# Patient Record
Sex: Female | Born: 1965 | Race: White | Hispanic: No | Marital: Married | Smoking: Current every day smoker
Health system: Southern US, Community
[De-identification: ages and names within clinical notes are randomized; demographics above are authoritative.]

## PROBLEM LIST (undated history)

## (undated) DIAGNOSIS — G43909 Migraine, unspecified, not intractable, without status migrainosus: Secondary | ICD-10-CM

## (undated) DIAGNOSIS — K5909 Other constipation: Secondary | ICD-10-CM

## (undated) DIAGNOSIS — G4730 Sleep apnea, unspecified: Secondary | ICD-10-CM

## (undated) DIAGNOSIS — N83201 Unspecified ovarian cyst, right side: Secondary | ICD-10-CM

---

## 1999-03-01 ENCOUNTER — Other Ambulatory Visit: Admission: RE | Admit: 1999-03-01 | Discharge: 1999-03-01 | Payer: Self-pay | Admitting: Obstetrics and Gynecology

## 1999-04-28 ENCOUNTER — Encounter: Admission: RE | Admit: 1999-04-28 | Discharge: 1999-04-28 | Payer: Self-pay | Admitting: Family Medicine

## 1999-04-28 ENCOUNTER — Encounter: Payer: Self-pay | Admitting: Family Medicine

## 2000-04-01 ENCOUNTER — Other Ambulatory Visit: Admission: RE | Admit: 2000-04-01 | Discharge: 2000-04-01 | Payer: Self-pay | Admitting: Obstetrics and Gynecology

## 2001-05-13 ENCOUNTER — Other Ambulatory Visit: Admission: RE | Admit: 2001-05-13 | Discharge: 2001-05-13 | Payer: Self-pay | Admitting: Obstetrics and Gynecology

## 2002-06-04 ENCOUNTER — Other Ambulatory Visit: Admission: RE | Admit: 2002-06-04 | Discharge: 2002-06-04 | Payer: Self-pay | Admitting: Obstetrics and Gynecology

## 2003-07-21 ENCOUNTER — Ambulatory Visit (HOSPITAL_BASED_OUTPATIENT_CLINIC_OR_DEPARTMENT_OTHER): Admission: RE | Admit: 2003-07-21 | Discharge: 2003-07-21 | Payer: Self-pay | Admitting: Family Medicine

## 2003-08-25 ENCOUNTER — Ambulatory Visit (HOSPITAL_BASED_OUTPATIENT_CLINIC_OR_DEPARTMENT_OTHER): Admission: RE | Admit: 2003-08-25 | Discharge: 2003-08-25 | Payer: Self-pay | Admitting: Family Medicine

## 2003-09-02 ENCOUNTER — Other Ambulatory Visit: Admission: RE | Admit: 2003-09-02 | Discharge: 2003-09-02 | Payer: Self-pay | Admitting: Obstetrics and Gynecology

## 2004-12-25 ENCOUNTER — Other Ambulatory Visit: Admission: RE | Admit: 2004-12-25 | Discharge: 2004-12-25 | Payer: Self-pay | Admitting: Obstetrics and Gynecology

## 2005-04-05 ENCOUNTER — Encounter: Admission: RE | Admit: 2005-04-05 | Discharge: 2005-04-05 | Payer: Self-pay | Admitting: Family Medicine

## 2005-08-10 ENCOUNTER — Encounter: Admission: RE | Admit: 2005-08-10 | Discharge: 2005-08-10 | Payer: Self-pay | Admitting: Family Medicine

## 2008-11-19 ENCOUNTER — Ambulatory Visit (HOSPITAL_COMMUNITY): Admission: RE | Admit: 2008-11-19 | Discharge: 2008-11-19 | Payer: Self-pay | Admitting: Obstetrics and Gynecology

## 2008-11-19 HISTORY — PX: LAPAROSCOPIC TUBAL LIGATION W/ FILCHIE CLIPS: SHX1938

## 2010-08-28 ENCOUNTER — Ambulatory Visit: Payer: BC Managed Care – PPO | Admitting: Physical Therapy

## 2010-10-03 LAB — CBC
HCT: 38.8 % (ref 36.0–46.0)
Hemoglobin: 13.7 g/dL (ref 12.0–15.0)
MCHC: 35.4 g/dL (ref 30.0–36.0)
MCV: 92.3 fL (ref 78.0–100.0)
Platelets: 282 10*3/uL (ref 150–400)
RBC: 4.2 MIL/uL (ref 3.87–5.11)
RDW: 12.8 % (ref 11.5–15.5)
WBC: 8.3 10*3/uL (ref 4.0–10.5)

## 2010-10-03 LAB — PREGNANCY, URINE: Preg Test, Ur: NEGATIVE

## 2010-11-07 NOTE — Op Note (Signed)
[name], Joy Barron                  ACCOUNT NO.:  [id number]   MEDICAL RECORD NO.:  [id number]          PATIENT TYPE:  AMB   LOCATION:  SDC                           FACILITY:  [facility]   PHYSICIAN:  Duke Salvia. Marcelle Overlie, M.D.DATE OF BIRTH:  1965-10-09   DATE OF PROCEDURE:  11/19/2008  DATE OF DISCHARGE:  11/19/2008                               OPERATIVE REPORT   PREOPERATIVE DIAGNOSIS:  Requests permanent sterilization.   POSTOPERATIVE DIAGNOSIS:  Requests permanent sterilization.   PROCEDURE:  Laparoscopic tubal by Filshie clip application.   SURGEON:  Duke Salvia. Marcelle Overlie, MD   ANESTHESIA:  General.   COMPLICATIONS:  None.   DRAINS:  In-and-out Foley catheter.   BLOOD LOSS:  Minimum.   SPECIMENS:  None.   PROCEDURE AND FINDINGS:  The patient was taken to the operating room.  After adequate level of general anesthesia was obtained with the patient  legs in stirrups.  The abdomen, perineum, and vagina were prepped for  laparoscopy.  The bladder was drained, EUA carried out, uterus  midposition, normal size, and adnexa negative.  A Hulka tenaculum was  positioned.  Attention directed to the abdomen.  The subumbilical area  was infiltrated with 0.25% Marcaine plain.  A Veress needle was  introduced without difficulty; its intra-abdominal position verified by  pressure and water testing.  After 2.5 liter pneumoperitoneum was then  created, laparoscopic trocar and sleeve were then introduced without  difficulty.  There was no evidence of any bleeding or trauma.  She was  placed in Trendelenburg.  Pelvic findings inspected carefully were noted  to be normal, 0.25% Marcaine 4-5 mL were then dripped across each tube  from the cornu to the fimbriated end.  Filshie clip was then applied at  the right angle, 2-3 cm from the cornu on each side at a right angle  completely engulfing the tube on each side.  After this was  accomplished, careful inspection revealed good application and  photographs were taken on each side.  Instrument were removed, gas  allowed to escape.  Defect was closed with 4 Dexon subcuticular sutures.  She tolerated this well, went to recovery room in good condition.      Richard M. Marcelle Overlie, M.D.  Electronically Signed    RMH/MEDQ  D:  11/19/2008  T:  11/20/2008  Job:  [id number]

## 2010-11-07 NOTE — H&P (Signed)
NAMECODY, Joy Barron                  ACCOUNT NO.:  [id number]   MEDICAL RECORD NO.:  [id number]          PATIENT TYPE:  AMB   LOCATION:                                [facility]  [facility]   PHYSICIAN:  Duke Salvia. Marcelle Overlie, M.D.DATE OF BIRTH:  March 01, 1966   DATE OF ADMISSION:  11/19/2008  DATE OF DISCHARGE:                              HISTORY & PHYSICAL   CHIEF COMPLAINT:  Requests permanent sterilization.   HISTORY OF PRESENT ILLNESS:  A 45 year old GO, PO currently on OCPs  presents for permanent sterilization.  She is sure she would not want to  be pregnant under any circumstance.  This procedure including the risk  of bleeding, infection, adjacent organ injury, the possible need for  open additional surgery all reviewed with her.  The permanence of the  procedure and failure rate of 2-3 per thousand discussed which she  understands and accepts.   PAST MEDICAL HISTORY:   ALLERGIES:  None.   CURRENT MEDICATIONS:  1. Paxil CR 37.5 daily.  2. Seasonique.   FAMILY HISTORY:  Unremarkable.   REVIEW OF SYSTEMS:  Significant for history of migraine headache.   PRIOR SURGERY:  None.   SOCIAL HISTORY:  Denies drug or alcohol use.  She does smoke one half  PPD.   PHYSICAL EXAMINATION:  VITAL SIGNS:  Temperature 98.2, blood pressure  120/88.  HEENT:  Unremarkable.  NECK:  Supple without masses.  LUNGS:  Clear.  CARDIOVASCULAR:  Regular rate and rhythm without murmurs, rubs, or  gallops.  BREASTS:  Without masses.  ABDOMEN:  Soft, flat, nontender.  PELVIC:  Normal external genitalia.  High vaginal swab is clear.  Uterus  midposition, normal size.  Adnexa negative.  EXTREMITIES:  Unremarkable.  NEUROLOGIC:  Unremarkable.   IMPRESSION:  Requests permanent sterilization.   PLAN:  Filshie clip tubal.  Procedure and risks reviewed as above.      Richard M. Marcelle Overlie, M.D.  Electronically Signed     RMH/MEDQ  D:  11/09/2008  T:  11/10/2008  Job:  [id number]

## 2011-11-27 ENCOUNTER — Encounter: Payer: BC Managed Care – PPO | Attending: "Endocrinology | Admitting: *Deleted

## 2011-11-27 DIAGNOSIS — Z713 Dietary counseling and surveillance: Secondary | ICD-10-CM

## 2011-11-28 NOTE — Progress Notes (Signed)
Patient attended basic nutrition class on 11/27/11.  Topics covered include:   1. Complications of Hyperlipidemia and/or Hypertension. 2. Ways to reduce risk of heart disease.  3. Identifying fat and sodium content on food labels. 4. Ways to decrease sodium intake. 5. Optimal amount of daily saturated fat intake. 6. Optimal amount of daily sodium intake.  7. Foods to limit/avoid on a heart healthy diet. 8. MyPlate and portion control.   Patient to follow-up with [facility] prn.  

## 2013-09-29 ENCOUNTER — Encounter (HOSPITAL_COMMUNITY): Payer: Self-pay | Admitting: *Deleted

## 2013-10-01 NOTE — H&P (Signed)
Joy Barron  DICTATION # [id number] CSN# [id number]   Joy Picaichard [name], MD 10/01/2013 12:52 PM

## 2013-10-02 ENCOUNTER — Encounter (HOSPITAL_COMMUNITY): Payer: Self-pay | Admitting: Pharmacist

## 2013-10-02 NOTE — H&P (Signed)
NAMCarren Barron:  Edsall, Maddalyn                  ACCOUNT NO.:  [id number]  MEDICAL RECORD NO.:  [id number]  LOCATION:                                [facility]ILITY:  [facility]  PHYSICIAN:  Duke Salviaichard M. Marcelle OverlieHolland, M.D.    DATE OF BIRTH:  DATE OF ADMISSION:  10/15/2013 DATE OF DISCHARGE:                             HISTORY & PHYSICAL   CHIEF COMPLAINT:  Menorrhagia, endometrial polyps.  HISTORY OF PRESENT ILLNESS:  A 48 year old, G0, P0 perimenopausal patient prior tubal, when seen earlier this year was complaining of unusually heavy cycles over the last 6 months.  Her FSH was 5.7.  FHG done in our office September 15, 2013 demonstrated in the right ovary a 5.8 x 4.3 x 5.7 simple cyst.  No free fluid.  With saline infusion, there was 1 or perhaps 2 well defined endometrial polyps.  She presents now for D and C, hysteroscopy with Truclear resection.  This procedure including specific risks related to bleeding, infection, other complications that may require additional surgery such as uterine perforation along with her expected recovery time discussed with her which she understands and accepts.  PAST MEDICAL HISTORY:  ALLERGIES:  IBUPROFEN.  CURRENT MEDICATIONS:  Frova p.r.n., Flonase p.r.n.  PRIOR SURGICAL HISTORY:  Tubal.  REVIEW OF SYSTEMS:  Significant for migraine.  FAMILY HISTORY:  Significant for heart disease, kidney disease, arthritis, and hypertension.  SOCIAL HISTORY:  Denies alcohol or drug use.  She is married.  Smokes one-half PPD.  Last Pap March 2015, was normal.  Of note, in 2010, when she had lap tubal pelvic findings were normal.  PHYSICAL EXAMINATION:  VITAL SIGNS:  Temp 98.2, blood pressure 130/78. HEENT:  Unremarkable. NECK:  Supple without masses. LUNGS:  Clear. CARDIOVASCULAR:  Regular rate and rhythm without murmurs, rubs, or gallops. BREASTS:  Without masses. ABDOMEN:  Soft, flat, nontender. PELVIC:  Vulva, vagina and cervix normal.  Uterus mid position, normal size.  Adnexa  negative. EXTREMITIES:  Unremarkable. NEUROLOGIC:  Unremarkable.  IMPRESSION:  Menorrhagia, endometrial polyps.  PLAN:  D and C hysteroscopy with Truclear resection.  Procedure and risks reviewed as above.     Dejanee Thibeaux M. Marcelle OverlieHolland, M.D.     RMH/MEDQ  D:  10/01/2013  T:  10/01/2013  Job:  [id number]

## 2013-10-14 MED ORDER — DEXTROSE 5 % IV SOLN
2.0000 g | INTRAVENOUS | Status: AC
  Administered 2013-10-15: 2 g via INTRAVENOUS
  Filled 2013-10-14: qty 2

## 2013-10-15 ENCOUNTER — Encounter (HOSPITAL_COMMUNITY)
Admission: RE | Disposition: A | Discharge status: Home / Self Care | Payer: Self-pay | Source: Ambulatory Visit | Attending: Obstetrics and Gynecology

## 2013-10-15 ENCOUNTER — Encounter (HOSPITAL_COMMUNITY): Payer: Self-pay | Admitting: Anesthesiology

## 2013-10-15 ENCOUNTER — Ambulatory Visit (HOSPITAL_COMMUNITY)
Admission: RE | Admit: 2013-10-15 | Discharge: 2013-10-15 | Disposition: A | Discharge status: Home / Self Care | Payer: BC Managed Care – PPO | Source: Ambulatory Visit | Attending: Obstetrics and Gynecology | Admitting: Obstetrics and Gynecology

## 2013-10-15 ENCOUNTER — Encounter (HOSPITAL_COMMUNITY): Payer: BC Managed Care – PPO | Admitting: Anesthesiology

## 2013-10-15 ENCOUNTER — Ambulatory Visit (HOSPITAL_COMMUNITY): Payer: BC Managed Care – PPO | Admitting: Anesthesiology

## 2013-10-15 DIAGNOSIS — N949 Unspecified condition associated with female genital organs and menstrual cycle: Secondary | ICD-10-CM | POA: Insufficient documentation

## 2013-10-15 DIAGNOSIS — N925 Other specified irregular menstruation: Secondary | ICD-10-CM | POA: Insufficient documentation

## 2013-10-15 DIAGNOSIS — F17200 Nicotine dependence, unspecified, uncomplicated: Secondary | ICD-10-CM | POA: Insufficient documentation

## 2013-10-15 DIAGNOSIS — N938 Other specified abnormal uterine and vaginal bleeding: Secondary | ICD-10-CM | POA: Insufficient documentation

## 2013-10-15 DIAGNOSIS — N920 Excessive and frequent menstruation with regular cycle: Secondary | ICD-10-CM | POA: Insufficient documentation

## 2013-10-15 DIAGNOSIS — N840 Polyp of corpus uteri: Secondary | ICD-10-CM | POA: Insufficient documentation

## 2013-10-15 HISTORY — PX: DILATATION & CURETTAGE/HYSTEROSCOPY WITH TRUECLEAR: SHX6353

## 2013-10-15 LAB — CBC
HCT: 38.1 % (ref 36.0–46.0)
Hemoglobin: 13.3 g/dL (ref 12.0–15.0)
MCH: 30.8 pg (ref 26.0–34.0)
MCHC: 34.9 g/dL (ref 30.0–36.0)
MCV: 88.2 fL (ref 78.0–100.0)
Platelets: 250 10*3/uL (ref 150–400)
RBC: 4.32 MIL/uL (ref 3.87–5.11)
RDW: 12.3 % (ref 11.5–15.5)
WBC: 7.7 10*3/uL (ref 4.0–10.5)

## 2013-10-15 LAB — PREGNANCY, URINE: Preg Test, Ur: NEGATIVE

## 2013-10-15 SURGERY — DILATATION & CURETTAGE/HYSTEROSCOPY WITH TRUCLEAR
Anesthesia: General | Site: Vagina

## 2013-10-15 MED ORDER — LACTATED RINGERS IV SOLN
INTRAVENOUS | Status: DC
  Administered 2013-10-15 (×2): via INTRAVENOUS

## 2013-10-15 MED ORDER — DEXAMETHASONE SODIUM PHOSPHATE 10 MG/ML IJ SOLN
INTRAMUSCULAR | Status: DC | PRN
  Administered 2013-10-15: 10 mg via INTRAVENOUS

## 2013-10-15 MED ORDER — MIDAZOLAM HCL 2 MG/2ML IJ SOLN
INTRAMUSCULAR | Status: DC | PRN
  Administered 2013-10-15: 2 mg via INTRAVENOUS

## 2013-10-15 MED ORDER — ONDANSETRON HCL 4 MG/2ML IJ SOLN
INTRAMUSCULAR | Status: DC | PRN
  Administered 2013-10-15: 4 mg via INTRAVENOUS

## 2013-10-15 MED ORDER — ONDANSETRON HCL 4 MG/2ML IJ SOLN
INTRAMUSCULAR | Status: AC
  Filled 2013-10-15: qty 2

## 2013-10-15 MED ORDER — PROMETHAZINE HCL 25 MG/ML IJ SOLN: 6.2500 mg | INTRAMUSCULAR | Status: DC | PRN

## 2013-10-15 MED ORDER — KETOROLAC TROMETHAMINE 30 MG/ML IJ SOLN
INTRAMUSCULAR | Status: AC
  Filled 2013-10-15: qty 1

## 2013-10-15 MED ORDER — PROPOFOL 10 MG/ML IV EMUL
INTRAVENOUS | Status: AC
  Filled 2013-10-15: qty 20

## 2013-10-15 MED ORDER — LIDOCAINE HCL (CARDIAC) 20 MG/ML IV SOLN
INTRAVENOUS | Status: DC | PRN
  Administered 2013-10-15: 80 mg via INTRAVENOUS

## 2013-10-15 MED ORDER — LIDOCAINE HCL (CARDIAC) 20 MG/ML IV SOLN
INTRAVENOUS | Status: AC
  Filled 2013-10-15: qty 5

## 2013-10-15 MED ORDER — LIDOCAINE HCL 1 % IJ SOLN
INTRAMUSCULAR | Status: AC
  Filled 2013-10-15: qty 20

## 2013-10-15 MED ORDER — MEPERIDINE HCL 25 MG/ML IJ SOLN: 6.2500 mg | INTRAMUSCULAR | Status: DC | PRN

## 2013-10-15 MED ORDER — FENTANYL CITRATE 0.05 MG/ML IJ SOLN
INTRAMUSCULAR | Status: AC
  Filled 2013-10-15: qty 2

## 2013-10-15 MED ORDER — SODIUM CHLORIDE 0.9 % IR SOLN
Status: DC | PRN
  Administered 2013-10-15: 3000 mL

## 2013-10-15 MED ORDER — HYDROCODONE-IBUPROFEN 7.5-200 MG PO TABS: 1.0000 | ORAL_TABLET | Freq: Three times a day (TID) | ORAL | Status: DC | PRN

## 2013-10-15 MED ORDER — FENTANYL CITRATE 0.05 MG/ML IJ SOLN
25.0000 ug | INTRAMUSCULAR | Status: DC | PRN
Start: 2013-10-15 — End: 2013-10-15

## 2013-10-15 MED ORDER — MIDAZOLAM HCL 2 MG/2ML IJ SOLN
INTRAMUSCULAR | Status: AC
  Filled 2013-10-15: qty 2

## 2013-10-15 MED ORDER — MIDAZOLAM HCL 2 MG/2ML IJ SOLN: 0.5000 mg | Freq: Once | INTRAMUSCULAR | Status: DC | PRN

## 2013-10-15 MED ORDER — PROPOFOL 10 MG/ML IV BOLUS
INTRAVENOUS | Status: DC | PRN
  Administered 2013-10-15: 180 mg via INTRAVENOUS

## 2013-10-15 MED ORDER — LIDOCAINE HCL 1 % IJ SOLN
INTRAMUSCULAR | Status: DC | PRN
  Administered 2013-10-15: 9 mL

## 2013-10-15 MED ORDER — FENTANYL CITRATE 0.05 MG/ML IJ SOLN
INTRAMUSCULAR | Status: DC | PRN
  Administered 2013-10-15 (×2): 50 ug via INTRAVENOUS

## 2013-10-15 MED ORDER — DEXAMETHASONE SODIUM PHOSPHATE 10 MG/ML IJ SOLN
INTRAMUSCULAR | Status: AC
  Filled 2013-10-15: qty 1

## 2013-10-15 SURGICAL SUPPLY — 18 items
BLADE INCISOR TRUC PLUS 2.9 (ABLATOR) IMPLANT
CANISTERS HI-FLOW 3000CC (CANNISTER) IMPLANT
CATH ROBINSON RED A/P 16FR (CATHETERS) ×2 IMPLANT
CLOTH BEACON ORANGE TIMEOUT ST (SAFETY) ×2 IMPLANT
CONTAINER PREFILL 10% NBF 60ML (FORM) ×4 IMPLANT
DRAPE HYSTEROSCOPY (DRAPE) ×2 IMPLANT
DRSG TELFA 3X8 NADH (GAUZE/BANDAGES/DRESSINGS) ×2 IMPLANT
GLOVE BIO SURGEON STRL SZ7 (GLOVE) ×4 IMPLANT
GOWN STRL REUS W/TWL LRG LVL3 (GOWN DISPOSABLE) ×4 IMPLANT
INCISOR TRUC PLUS BLADE 2.9 (ABLATOR)
KIT HYSTEROSCOPY TRUCLEAR (ABLATOR) IMPLANT
MORCELLATOR RECIP TRUCLEAR 4.0 (ABLATOR) IMPLANT
NEEDLE SPNL 22GX3.5 QUINCKE BK (NEEDLE) ×2 IMPLANT
PACK VAGINAL MINOR WOMEN LF (CUSTOM PROCEDURE TRAY) ×2 IMPLANT
PAD OB MATERNITY 4.3X12.25 (PERSONAL CARE ITEMS) ×2 IMPLANT
SYR CONTROL 10ML LL (SYRINGE) ×2 IMPLANT
TOWEL OR 17X24 6PK STRL BLUE (TOWEL DISPOSABLE) ×4 IMPLANT
WATER STERILE IRR 1000ML POUR (IV SOLUTION) ×2 IMPLANT

## 2013-10-15 NOTE — Op Note (Signed)
Preoperative diagnosis: Abnormal uterine bleeding, endometrial polyps  Postoperative diagnosis: Same  Procedure: D&C, hysteroscopy with true clear resection of endometrial polyps.  Surgeon: Marcelle OverlieHolland  Anesthesia: Gen.  Specimens removed: Endometrial polyp, curettings, to pathology  EBL: Less than 50 cc  Procedure and findings:  The patient taken to the operating room after an adequate level of general anesthesia was obtained with the legs in stirrups the perineum and vagina were prepped and draped in the usual fashion, the bladder was drained. Appropriate timeout taken at that point. EUA carried out the uterus was midposition, normal size, adnexa negative. Speculum was positioned, cervix was grasped with a tenaculum, paracervical block was then created by infiltrating at 3 and 9:00 submucosally, 5-7 cc 1% plain Xylocaine at each site after negative aspiration. The uterus is then sounded to 9 cm, progressively dilated to a 27-29 Pratt dilator. Continuous flow hysteroscope was inserted one small and one large polyp were noted. Initial attempts to resect the larger polyps were proceeding slowly, polyp forcep was then used to grasp the polyp and it wasavulsed  in toto. The scope was reinserted there was some shaggy endometrium and one small polyp that were resected with the true clear resector was fragments were submitted likewise. Minimal bleeding the cavity was clean at the end of the procedure. She tolerated this well went to recovery room in good condition.  Dictated with [facility]  Natausha Jungwirth M. Milana ObeyHolland M.D.

## 2013-10-15 NOTE — Anesthesia Postprocedure Evaluation (Signed)
Anesthesia Post Note  Patient: Joy Barron  Procedure(s) Performed: Procedure(s) (LRB): DILATATION & CURETTAGE/HYSTEROSCOPY WITH TRUCLEAR (N/A)  Anesthesia type: General  Patient location: PACU  Post pain: Pain level controlled  Post assessment: Post-op Vital signs reviewed  Last Vitals:  Filed Vitals:   10/15/13 1400  BP: 123/87  Pulse: 95  Temp:   Resp: 16    Post vital signs: Reviewed  Level of consciousness: sedated  Complications: No apparent anesthesia complications

## 2013-10-15 NOTE — Transfer of Care (Signed)
Immediate Anesthesia Transfer of Care Note  Patient: Joy Barron  Procedure(s) Performed: Procedure(s): DILATATION & CURETTAGE/HYSTEROSCOPY WITH TRUCLEAR (N/A)  Patient Location: PACU  Anesthesia Type:General  Level of Consciousness: awake, alert  and oriented  Airway & Oxygen Therapy: Patient Spontanous Breathing and Patient connected to nasal cannula oxygen  Post-op Assessment: Report given to PACU RN, Post -op Vital signs reviewed and stable and Patient moving all extremities  Post vital signs: Reviewed and stable  Complications: No apparent anesthesia complications

## 2013-10-15 NOTE — Progress Notes (Signed)
The patient was re-examined with no change in status 

## 2013-10-15 NOTE — Anesthesia Preprocedure Evaluation (Addendum)
Anesthesia Evaluation  Patient identified by MRN, date of birth, ID band Patient awake    Reviewed: Allergy & Precautions, H&P , Patient's Chart, lab work & pertinent test results, reviewed documented beta blocker date and time   History of Anesthesia Complications Negative for: history of anesthetic complications  Airway Mallampati: II TM Distance: >3 FB Neck ROM: full    Dental   Pulmonary Current Smoker,  breath sounds clear to auscultation        Cardiovascular Exercise Tolerance: Good Rhythm:regular Rate:Normal     Neuro/Psych negative psych ROS   GI/Hepatic   Endo/Other    Renal/GU      Musculoskeletal   Abdominal   Peds  Hematology   Anesthesia Other Findings   Reproductive/Obstetrics                           Anesthesia Physical Anesthesia Plan  ASA: II  Anesthesia Plan: General LMA   Post-op Pain Management:    Induction:   Airway Management Planned:   Additional Equipment:   Intra-op Plan:   Post-operative Plan:   Informed Consent: I have reviewed the patients History and Physical, chart, labs and discussed the procedure including the risks, benefits and alternatives for the proposed anesthesia with the patient or authorized representative who has indicated his/her understanding and acceptance.   Dental Advisory Given  Plan Discussed with: CRNA, Surgeon and Anesthesiologist  Anesthesia Plan Comments:         Anesthesia Quick Evaluation  

## 2013-10-15 NOTE — Discharge Instructions (Signed)
DISCHARGE INSTRUCTIONS: HYSTEROSCOPY / ENDOMETRIAL ABLATION °The following instructions have been prepared to help you care for yourself upon your return home. ° °Personal hygiene: °• Use sanitary pads for vaginal drainage, not tampons. °• Shower the day after your procedure. °• NO tub baths, pools or Jacuzzis for 2-3 weeks. °• Wipe front to back after using the bathroom. ° °Activity and limitations: °• Do NOT drive or operate any equipment for 24 hours. The effects of anesthesia are still present °and drowsiness may result. °• Do NOT rest in bed all day. °• Walking is encouraged. °• Walk up and down stairs slowly. °• You may resume your normal activity in one to two days or as indicated by your physician. °Sexual activity: NO intercourse for at least 2 weeks after the procedure, or as indicated by your °Doctor. ° °Diet: Eat a light meal as desired this evening. You may resume your usual diet tomorrow. ° °Return to Work: You may resume your work activities in one to two days or as indicated by your °Doctor. ° °What to expect after your surgery: Expect to have vaginal bleeding/discharge for 2-3 days and °spotting for up to 10 days. It is not unusual to have soreness for up to 1-2 weeks. You may have a °slight burning sensation when you urinate for the first day. Mild cramps may continue for a couple of °days. You may have a regular period in 2-6 weeks. ° °Call your doctor for any of the following: °• Excessive vaginal bleeding or clotting, saturating and changing one pad every hour. °• Inability to urinate 6 hours after discharge from hospital. °• Pain not relieved by pain medication. °• Fever of 100.4° F or greater. °• Unusual vaginal discharge or odor. ° °Post Anesthesia Care Unit [phone] °

## 2013-10-16 ENCOUNTER — Encounter (HOSPITAL_COMMUNITY): Payer: Self-pay | Admitting: Obstetrics and Gynecology

## 2014-09-13 ENCOUNTER — Other Ambulatory Visit: Payer: Self-pay | Admitting: Family Medicine

## 2014-09-13 DIAGNOSIS — R1030 Lower abdominal pain, unspecified: Secondary | ICD-10-CM

## 2014-09-16 ENCOUNTER — Ambulatory Visit
Admission: RE | Admit: 2014-09-16 | Discharge: 2014-09-16 | Disposition: A | Discharge status: Home / Self Care | Payer: BLUE CROSS/BLUE SHIELD | Source: Ambulatory Visit | Attending: Family Medicine | Admitting: Family Medicine

## 2014-09-16 DIAGNOSIS — R1030 Lower abdominal pain, unspecified: Secondary | ICD-10-CM

## 2014-09-16 MED ORDER — IOPAMIDOL (ISOVUE-300) INJECTION 61%
100.0000 mL | Freq: Once | INTRAVENOUS | Status: AC | PRN
  Administered 2014-09-16: 100 mL via INTRAVENOUS

## 2014-09-23 ENCOUNTER — Other Ambulatory Visit: Payer: Self-pay | Admitting: Family Medicine

## 2014-09-23 DIAGNOSIS — E278 Other specified disorders of adrenal gland: Secondary | ICD-10-CM

## 2014-09-27 ENCOUNTER — Ambulatory Visit
Admission: RE | Admit: 2014-09-27 | Discharge: 2014-09-27 | Disposition: A | Discharge status: Home / Self Care | Payer: BLUE CROSS/BLUE SHIELD | Source: Ambulatory Visit | Attending: Family Medicine | Admitting: Family Medicine

## 2014-09-27 DIAGNOSIS — E278 Other specified disorders of adrenal gland: Secondary | ICD-10-CM

## 2015-01-31 NOTE — H&P (Signed)
Joy Barron  DICTATION # [id number] CSN# [id number]   Meriel Pica, MD 01/31/2015 8:52 AM

## 2015-02-01 ENCOUNTER — Encounter (HOSPITAL_BASED_OUTPATIENT_CLINIC_OR_DEPARTMENT_OTHER): Payer: Self-pay | Admitting: *Deleted

## 2015-02-01 NOTE — H&P (Signed)
NAMELEONA, ALEN                  ACCOUNT NO.:  [id number]  MEDICAL RECORD NO.:  [id number]  LOCATION:  PERIO                         FACILITY:  [facility]  PHYSICIAN:  Duke Salvia. Marcelle Overlie, M.D.DATE OF BIRTH:  08/18/1965  DATE OF ADMISSION:  02/08/2015 DATE OF DISCHARGE:                             HISTORY & PHYSICAL   CHIEF COMPLAINT:  Pelvic pain, ovarian cyst.  HPI:  A 49 year old, G0, P0, prior tubal, who had noticed some increased pelvic pain.  When she presented to her PCP earlier this year, CT scan was ordered that was normal except for showing a right ovarian cyst.  No free fluid.  With followup ultrasound a month later showing a 6 x 4 x 5.6 cm simple cyst.  No free fluid.  This was in April of this year. She elected to follow it since it was not causing her any symptoms at that time.  Followup ultrasound on December 09, 2014, showed basically no change.  She was fine and continued to observe at that point, since she was asymptomatic.  Due to some problems with increased pain, she presented in August of 2016, complaining of more discomfort and requesting to have an ovarian cystectomy, possible RSO performed.  CA- 125 was obtained in August of 2016 that was 10.  The procedure of laparoscopic cystectomy including specifics related to bleeding, infection, adjacent organ injury, the possible need to complete the surgery by open technique or possible USO discussed with her which she understands and accepts.  PAST MEDICAL HISTORY:  ALLERGIES:  Ibuprofen.  CURRENT MEDICATIONS:  CYMBALTA.  SURGICAL HISTORY:  She has had a D and C for removal of benign polyp and Filshie clip tubal.  REVIEW OF SYSTEMS:  Significant for history of migraine headache.  FAMILY HISTORY:  Significant for arthritis and hypertension.  SOCIAL HISTORY:  Smokes one-half PPD.  Denies alcohol or drug use.  She is married.  Dr. Manus Gunning is her PCP.  PHYSICAL EXAMINATION:  VITAL SIGNS:  Temp 98.2, blood pressure  120/60. HEENT:  Unremarkable. NECK:  Supple without masses. LUNGS:  Clear. CARDIOVASCULAR:  Regular rate and rhythm without murmurs, rubs, gallops. BREASTS:  Without masses. ABDOMEN:  Soft, flat, nontender.  Vulva, vagina, cervix normal.  Uterus mid position, slightly full on the right.  Difficult to delineate a specific mass.  Left side negative. EXTREMITIES:  Unremarkable. NEUROLOGIC:  Unremarkable.  IMPRESSION:  Persistent ovarian cyst, this appears to be a simple cyst, has all benign features.  PLAN:  Laparoscopy with ovarian cystectomy, possible USO.  Procedure and risks discussed as above.     Joby Richart M. Marcelle Overlie, M.D.     RMH/MEDQ  D:  01/31/2015  T:  02/01/2015  Job:  [id number]

## 2015-02-01 NOTE — Progress Notes (Signed)
NPO AFTER MN.  ARRIVE AT 0600.  NEEDS HG AND URINE PREG.  

## 2015-02-08 ENCOUNTER — Ambulatory Visit (HOSPITAL_BASED_OUTPATIENT_CLINIC_OR_DEPARTMENT_OTHER): Payer: BLUE CROSS/BLUE SHIELD | Admitting: Anesthesiology

## 2015-02-08 ENCOUNTER — Ambulatory Visit (HOSPITAL_COMMUNITY)
Admission: RE | Admit: 2015-02-08 | Discharge: 2015-02-08 | Disposition: A | Discharge status: Home / Self Care | Payer: BLUE CROSS/BLUE SHIELD | Source: Ambulatory Visit | Attending: Obstetrics and Gynecology | Admitting: Obstetrics and Gynecology

## 2015-02-08 ENCOUNTER — Encounter (HOSPITAL_BASED_OUTPATIENT_CLINIC_OR_DEPARTMENT_OTHER): Payer: Self-pay | Admitting: *Deleted

## 2015-02-08 ENCOUNTER — Encounter (HOSPITAL_BASED_OUTPATIENT_CLINIC_OR_DEPARTMENT_OTHER)
Admission: RE | Disposition: A | Discharge status: Home / Self Care | Payer: Self-pay | Source: Ambulatory Visit | Attending: Obstetrics and Gynecology

## 2015-02-08 DIAGNOSIS — F17200 Nicotine dependence, unspecified, uncomplicated: Secondary | ICD-10-CM | POA: Insufficient documentation

## 2015-02-08 DIAGNOSIS — Z79899 Other long term (current) drug therapy: Secondary | ICD-10-CM | POA: Diagnosis not present

## 2015-02-08 DIAGNOSIS — Z8669 Personal history of other diseases of the nervous system and sense organs: Secondary | ICD-10-CM | POA: Diagnosis not present

## 2015-02-08 DIAGNOSIS — G4730 Sleep apnea, unspecified: Secondary | ICD-10-CM | POA: Insufficient documentation

## 2015-02-08 DIAGNOSIS — N8320 Unspecified ovarian cysts: Secondary | ICD-10-CM | POA: Insufficient documentation

## 2015-02-08 DIAGNOSIS — R102 Pelvic and perineal pain: Secondary | ICD-10-CM | POA: Diagnosis present

## 2015-02-08 HISTORY — DX: Sleep apnea, unspecified: G47.30

## 2015-02-08 HISTORY — DX: Unspecified ovarian cyst, right side: N83.201

## 2015-02-08 HISTORY — PX: LAPAROSCOPY: SHX197

## 2015-02-08 HISTORY — PX: OVARIAN CYST REMOVAL: SHX89

## 2015-02-08 HISTORY — DX: Other constipation: K59.09

## 2015-02-08 HISTORY — DX: Migraine, unspecified, not intractable, without status migrainosus: G43.909

## 2015-02-08 LAB — POCT HEMOGLOBIN-HEMACUE: Hemoglobin: 13.3 g/dL (ref 12.0–15.0)

## 2015-02-08 SURGERY — LAPAROSCOPY, DIAGNOSTIC
Anesthesia: General | Laterality: Right

## 2015-02-08 MED ORDER — BUPIVACAINE HCL (PF) 0.25 % IJ SOLN
INTRAMUSCULAR | Status: DC | PRN
  Administered 2015-02-08: 12 mL

## 2015-02-08 MED ORDER — SUCCINYLCHOLINE CHLORIDE 20 MG/ML IJ SOLN
INTRAMUSCULAR | Status: DC | PRN
  Administered 2015-02-08: 100 mg via INTRAVENOUS

## 2015-02-08 MED ORDER — MIDAZOLAM HCL 5 MG/5ML IJ SOLN
INTRAMUSCULAR | Status: DC | PRN
  Administered 2015-02-08 (×2): 1 mg via INTRAVENOUS

## 2015-02-08 MED ORDER — MIDAZOLAM HCL 2 MG/2ML IJ SOLN
INTRAMUSCULAR | Status: AC
  Filled 2015-02-08: qty 2

## 2015-02-08 MED ORDER — LACTATED RINGERS IV SOLN
INTRAVENOUS | Status: DC
  Administered 2015-02-08 (×2): via INTRAVENOUS
  Filled 2015-02-08: qty 1000

## 2015-02-08 MED ORDER — LIDOCAINE HCL (CARDIAC) 20 MG/ML IV SOLN
INTRAVENOUS | Status: DC | PRN
  Administered 2015-02-08: 80 mg via INTRAVENOUS

## 2015-02-08 MED ORDER — FENTANYL CITRATE (PF) 100 MCG/2ML IJ SOLN
25.0000 ug | INTRAMUSCULAR | Status: DC | PRN
  Filled 2015-02-08: qty 1

## 2015-02-08 MED ORDER — OXYCODONE-ACETAMINOPHEN 5-325 MG PO TABS: 1.0000 | ORAL_TABLET | ORAL | Status: AC | PRN

## 2015-02-08 MED ORDER — ACETAMINOPHEN 10 MG/ML IV SOLN
INTRAVENOUS | Status: DC | PRN
  Administered 2015-02-08: 1000 mg via INTRAVENOUS

## 2015-02-08 MED ORDER — PROMETHAZINE HCL 25 MG/ML IJ SOLN
6.2500 mg | INTRAMUSCULAR | Status: DC | PRN
Start: 2015-02-08 — End: 2015-02-08
  Filled 2015-02-08: qty 1

## 2015-02-08 MED ORDER — FENTANYL CITRATE (PF) 100 MCG/2ML IJ SOLN
INTRAMUSCULAR | Status: DC | PRN
  Administered 2015-02-08 (×2): 25 ug via INTRAVENOUS
  Administered 2015-02-08: 50 ug via INTRAVENOUS
  Administered 2015-02-08 (×4): 25 ug via INTRAVENOUS

## 2015-02-08 MED ORDER — DEXAMETHASONE SODIUM PHOSPHATE 4 MG/ML IJ SOLN
INTRAMUSCULAR | Status: DC | PRN
  Administered 2015-02-08: 10 mg via INTRAVENOUS

## 2015-02-08 MED ORDER — ONDANSETRON HCL 4 MG/2ML IJ SOLN
INTRAMUSCULAR | Status: DC | PRN
  Administered 2015-02-08: 4 mg via INTRAVENOUS

## 2015-02-08 MED ORDER — FENTANYL CITRATE (PF) 100 MCG/2ML IJ SOLN
INTRAMUSCULAR | Status: AC
  Filled 2015-02-08: qty 4

## 2015-02-08 MED ORDER — PROPOFOL 10 MG/ML IV BOLUS
INTRAVENOUS | Status: DC | PRN
  Administered 2015-02-08: 200 mg via INTRAVENOUS

## 2015-02-08 SURGICAL SUPPLY — 61 items
APPLICATOR COTTON TIP 6IN STRL (MISCELLANEOUS) ×3 IMPLANT
BAG URINE DRAINAGE (UROLOGICAL SUPPLIES) IMPLANT
BANDAGE ADHESIVE 1X3 (GAUZE/BANDAGES/DRESSINGS) IMPLANT
BLADE CLIPPER SURG (BLADE) IMPLANT
BLADE SURG 11 STRL SS (BLADE) ×3 IMPLANT
CANISTER SUCTION 2500CC (MISCELLANEOUS) IMPLANT
CATH FOLEY 2WAY SLVR  5CC 14FR (CATHETERS)
CATH FOLEY 2WAY SLVR 5CC 14FR (CATHETERS) IMPLANT
CATH ROBINSON RED A/P 16FR (CATHETERS) ×3 IMPLANT
CLOTH BEACON ORANGE TIMEOUT ST (SAFETY) ×3 IMPLANT
COVER MAYO STAND STRL (DRAPES) ×3 IMPLANT
DRAPE UNDERBUTTOCKS STRL (DRAPE) ×3 IMPLANT
DRSG OPSITE POSTOP 3X4 (GAUZE/BANDAGES/DRESSINGS) ×3 IMPLANT
DRSG TEGADERM 4X4.75 (GAUZE/BANDAGES/DRESSINGS) ×3 IMPLANT
ELECT REM PT RETURN 9FT ADLT (ELECTROSURGICAL) ×3
ELECTRODE REM PT RTRN 9FT ADLT (ELECTROSURGICAL) ×2 IMPLANT
FILTER SMOKE EVAC LAPAROSHD (FILTER) IMPLANT
GLOVE BIO SURGEON STRL SZ7 (GLOVE) ×6 IMPLANT
GOWN STRL REUS W/ TWL LRG LVL3 (GOWN DISPOSABLE) ×4 IMPLANT
GOWN STRL REUS W/TWL LRG LVL3 (GOWN DISPOSABLE) ×2
LEGGING LITHOTOMY PAIR STRL (DRAPES) ×3 IMPLANT
LIQUID BAND (GAUZE/BANDAGES/DRESSINGS) IMPLANT
MANIFOLD NEPTUNE II (INSTRUMENTS) IMPLANT
NEEDLE HYPO 25X1 1.5 SAFETY (NEEDLE) ×3 IMPLANT
NEEDLE INSUFFLATION 14GA 120MM (NEEDLE) ×3 IMPLANT
NEEDLE INSUFFLATION 14GA 150MM (NEEDLE) IMPLANT
NS IRRIG 500ML POUR BTL (IV SOLUTION) ×3 IMPLANT
PACK BASIN DAY SURGERY FS (CUSTOM PROCEDURE TRAY) ×3 IMPLANT
PACK LAPAROSCOPY II (CUSTOM PROCEDURE TRAY) ×3 IMPLANT
PAD OB MATERNITY 4.3X12.25 (PERSONAL CARE ITEMS) ×3 IMPLANT
PAD PREP 24X48 CUFFED NSTRL (MISCELLANEOUS) ×3 IMPLANT
PADDING ION DISPOSABLE (MISCELLANEOUS) ×3 IMPLANT
PENCIL BUTTON HOLSTER BLD 10FT (ELECTRODE) IMPLANT
POUCH SPECIMEN RETRIEVAL 10MM (ENDOMECHANICALS) IMPLANT
SCALPEL HARMONIC ACE (MISCELLANEOUS) IMPLANT
SCISSORS LAP 5X35 DISP (ENDOMECHANICALS) IMPLANT
SEALER TISSUE G2 CVD JAW 35 (ENDOMECHANICALS) IMPLANT
SEALER TISSUE G2 CVD JAW 45CM (ENDOMECHANICALS) ×3 IMPLANT
SET IRRIG TUBING LAPAROSCOPIC (IRRIGATION / IRRIGATOR) ×3 IMPLANT
SOLUTION ANTI FOG 6CC (MISCELLANEOUS) ×3 IMPLANT
STRIP CLOSURE SKIN 1/4X4 (GAUZE/BANDAGES/DRESSINGS) IMPLANT
SUT MNCRL AB 3-0 PS2 18 (SUTURE) IMPLANT
SUT MNCRL AB 4-0 PS2 18 (SUTURE) ×6 IMPLANT
SUT MON AB 2-0 CT1 36 (SUTURE) IMPLANT
SUT PLAIN 4 0 FS 2 27 (SUTURE) IMPLANT
SUT VIC AB 2-0 UR6 27 (SUTURE) ×3 IMPLANT
SUT VICRYL 0 TIES 12 18 (SUTURE) IMPLANT
SUT VICRYL 0 UR6 27IN ABS (SUTURE) IMPLANT
SUT VICRYL RAPIDE 3 0 (SUTURE) IMPLANT
SUT VICRYL RAPIDE 4/0 PS 2 (SUTURE) IMPLANT
SYR 3ML 23GX1 SAFETY (SYRINGE) IMPLANT
SYR CONTROL 10ML LL (SYRINGE) ×3 IMPLANT
SYRINGE 10CC LL (SYRINGE) ×3 IMPLANT
TOWEL OR 17X24 6PK STRL BLUE (TOWEL DISPOSABLE) ×6 IMPLANT
TRAY DSU PREP LF (CUSTOM PROCEDURE TRAY) ×3 IMPLANT
TROCAR OPTI TIP 5M 100M (ENDOMECHANICALS) ×3 IMPLANT
TROCAR XCEL BLUNT TIP 100MML (ENDOMECHANICALS) IMPLANT
TROCAR XCEL DIL TIP R 11M (ENDOMECHANICALS) ×3 IMPLANT
TUBING INSUFFLATION 10FT LAP (TUBING) ×3 IMPLANT
VACUUM HOSE/TUBING 7/8INX6FT (MISCELLANEOUS) IMPLANT
WATER STERILE IRR 500ML POUR (IV SOLUTION) ×3 IMPLANT

## 2015-02-08 NOTE — Anesthesia Postprocedure Evaluation (Signed)
  Anesthesia Post-op Note  Patient: Joy Barron  Procedure(s) Performed: Procedure(s) (LRB): LAPAROSCOPY DIAGNOSTIC (N/A) OVARIAN CYSTECTOMY, possible RSO (Right)  Patient Location: PACU  Anesthesia Type: General  Level of Consciousness: awake and alert   Airway and Oxygen Therapy: Patient Spontanous Breathing  Post-op Pain: mild  Post-op Assessment: Post-op Vital signs reviewed, Patient's Cardiovascular Status Stable, Respiratory Function Stable, Patent Airway and No signs of Nausea or vomiting  Last Vitals:  Filed Vitals:   02/08/15 1056  BP: 135/89  Pulse: 102  Temp: 36.6 C  Resp: 16    Post-op Vital Signs: stable   Complications: No apparent anesthesia complications

## 2015-02-08 NOTE — Transfer of Care (Signed)
Immediate Anesthesia Transfer of Care Note  Patient: Joy Barron  Procedure(s) Performed: Procedure(s) (LRB): LAPAROSCOPY DIAGNOSTIC (N/A) OVARIAN CYSTECTOMY, possible RSO (Right)  Patient Location: PACU  Anesthesia Type: General  Level of Consciousness: awake, sedated, patient cooperative and responds to stimulation  Airway & Oxygen Therapy: Patient Spontanous Breathing and Patient connected to face mask oxygen  Post-op Assessment: Report given to PACU RN, Post -op Vital signs reviewed and stable and Patient moving all extremities  Post vital signs: Reviewed and stable  Complications: No apparent anesthesia complications

## 2015-02-08 NOTE — Op Note (Signed)
Preoperative diagnosis symptomatic right ovarian cyst  Postoperative diagnosis: Same  Procedure: Laparoscopic RSO  Surgeon: Marcelle Overlie  Anesthesia: Gen.  EBL: Less than 10 cc  Procedure and findings:  The patient taken the operating room after an adequate level of general anesthesia was obtained with the patient's legs in stirrups the abdomen perineum and vagina were prepped and draped in the usual fashion for laparoscopy. The bladder was drained. Hulka tenaculum was positioned. This was done after appropriate timeouts.  Attention directed to the abdomen where the subumbilical area was infiltrated with quarter percent Marcaine plain small incision was made in the varies needle was introduced without difficulty. Its intra-abdominal position verified by pressure water testing. After 2-1/2 L pneumoperitoneum syncopated, lap scopic trocar and sleeve sleeve were introduced, there was no evidence of any bleeding or trauma. 3 finger breaths above the symphysis in the midline a 5 mm trocar was inserted under direct visualization. The pelvic findings as follows  Uterus itself in left adnexa unremarkable Filshie clip was seen proximally on each tube on the right side there was a 5 cm smooth-walled mobile cyst. No other abnormalities noted. Using the Nezhat attachment with the coagulating needle tip, the cyst was scored and then deflated with the Nezhat. Once this was collapsed an atraumatic grasper was then used to grasp the remainder of the deflated cyst wall, decision made proceed with RSO up to the proximal Filshie clip. The Enseal device was then used to coagulate and divide with good hemostasis. The lower incision was enlarged slightly to allow removal of the deflated system portion of tube which is sent to pathology. Area was inspected and noted be hemostatic. Its was removed, gas allowed to escape. The lower incision closed fascia with a 20 Vicryls suture 4-0 Monocryl on the subcuticular, 4-0 Monocryl on  the subumbilical incision. She tolerated this well went to recovery room in good condition.  Dictated with [facility]  Felice Deem Milana Obey M.D.

## 2015-02-08 NOTE — Anesthesia Procedure Notes (Signed)
Procedure Name: Intubation Date/Time: 02/08/2015 7:25 AM Performed by: Jessica Priest Pre-anesthesia Checklist: Patient identified, Emergency Drugs available, Suction available and Patient being monitored Patient Re-evaluated:Patient Re-evaluated prior to inductionOxygen Delivery Method: Circle System Utilized Preoxygenation: Pre-oxygenation with 100% oxygen Intubation Type: IV induction Ventilation: Mask ventilation without difficulty Laryngoscope Size: Mac and 3 Grade View: Grade I Tube type: Oral Tube size: 7.0 mm Number of attempts: 1 Airway Equipment and Method: Stylet and Oral airway Placement Confirmation: ETT inserted through vocal cords under direct vision,  positive ETCO2 and breath sounds checked- equal and bilateral Tube secured with: Tape Dental Injury: Teeth and Oropharynx as per pre-operative assessment

## 2015-02-08 NOTE — Anesthesia Preprocedure Evaluation (Addendum)
Anesthesia Evaluation  Patient identified by MRN, date of birth, ID band Patient awake    Reviewed: Allergy & Precautions, NPO status , Patient's Chart, lab work & pertinent test results  Airway Mallampati: II  TM Distance: >3 FB Neck ROM: Full    Dental no notable dental hx.    Pulmonary sleep apnea , Current Smoker,  Mild sleep apnea, no cpap breath sounds clear to auscultation  Pulmonary exam normal       Cardiovascular Exercise Tolerance: Good negative cardio ROS Normal cardiovascular examRhythm:Regular Rate:Normal     Neuro/Psych  Headaches, negative psych ROS   GI/Hepatic negative GI ROS, Neg liver ROS,   Endo/Other  negative endocrine ROS  Renal/GU negative Renal ROS  negative genitourinary   Musculoskeletal negative musculoskeletal ROS (+)   Abdominal   Peds negative pediatric ROS (+)  Hematology negative hematology ROS (+)   Anesthesia Other Findings   Reproductive/Obstetrics negative OB ROS                           Anesthesia Physical Anesthesia Plan  ASA: II  Anesthesia Plan: General   Post-op Pain Management:    Induction: Intravenous  Airway Management Planned: Oral ETT  Additional Equipment:   Intra-op Plan:   Post-operative Plan: Extubation in OR  Informed Consent: I have reviewed the patients History and Physical, chart, labs and discussed the procedure including the risks, benefits and alternatives for the proposed anesthesia with the patient or authorized representative who has indicated his/her understanding and acceptance.   Dental advisory given  Plan Discussed with: CRNA  Anesthesia Plan Comments:         Anesthesia Quick Evaluation

## 2015-02-08 NOTE — Progress Notes (Signed)
The patient was re-examined with no change in status 

## 2015-02-08 NOTE — Discharge Instructions (Signed)

## 2015-02-09 ENCOUNTER — Encounter (HOSPITAL_BASED_OUTPATIENT_CLINIC_OR_DEPARTMENT_OTHER): Payer: Self-pay | Admitting: Obstetrics and Gynecology

## 2015-02-22 ENCOUNTER — Other Ambulatory Visit: Payer: Self-pay | Admitting: Obstetrics and Gynecology

## 2015-02-23 LAB — CYTOLOGY - PAP

## 2016-11-16 IMAGING — CT CT ABD-PELV W/ CM
3 of 5 series · 12 of 36 positions shown, 18 images · IV contrast (READICAT/WATER & [ID] ISOVUE 300)
Comparison: None.

CLINICAL DATA: Bilateral lower abdominal pain for 4 days.

EXAM:
CT ABDOMEN AND PELVIS WITH CONTRAST
TECHNIQUE: Multidetector CT imaging of the abdomen and pelvis was performed
using the standard protocol following bolus administration of
intravenous contrast.
CONTRAST:  100 cc [id number]

[Series 3: abd/pelvis with · axial · 0.66mm/px · z∈[-331,-41]mm · 6 of 81 slices shown, 11 images]
[im 12/81  soft-tissue]
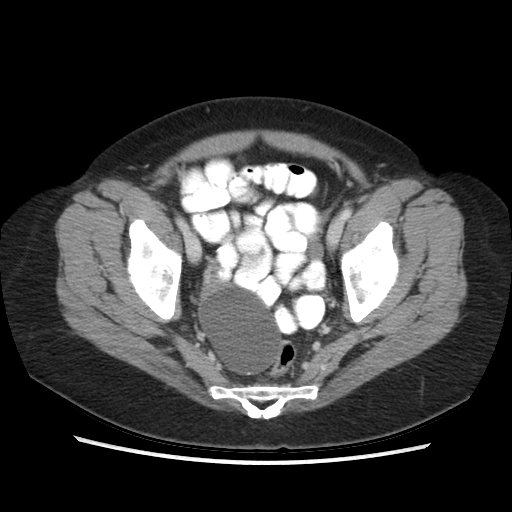
[im 12/81  bone]
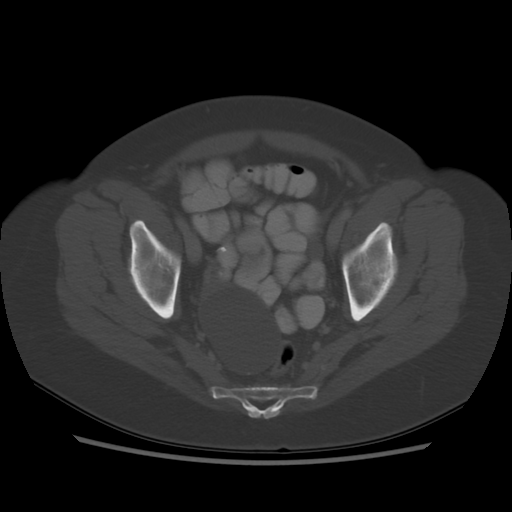
[im 23/81  soft-tissue]
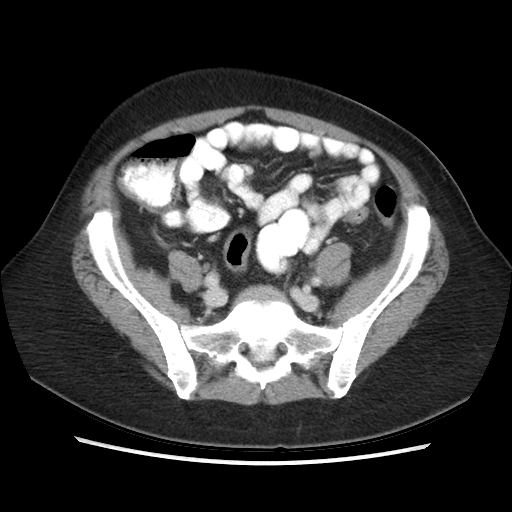
[im 35/81  soft-tissue]
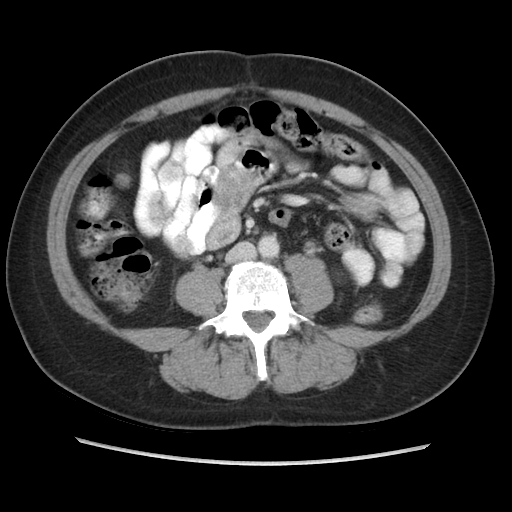
[im 35/81  lung]
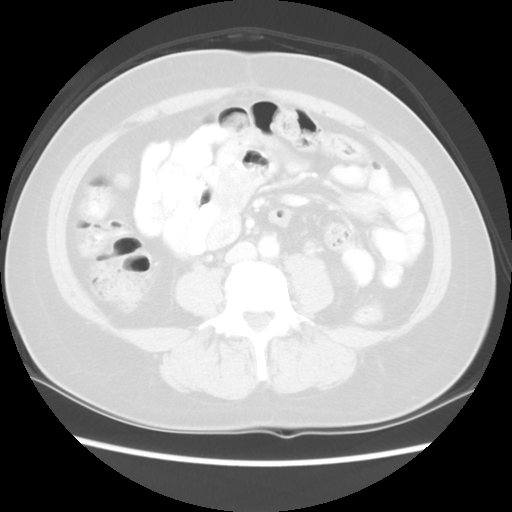
[im 46/81  soft-tissue]
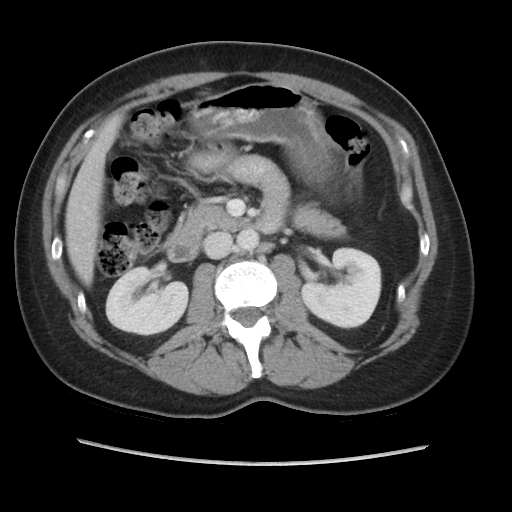
[im 46/81  lung]
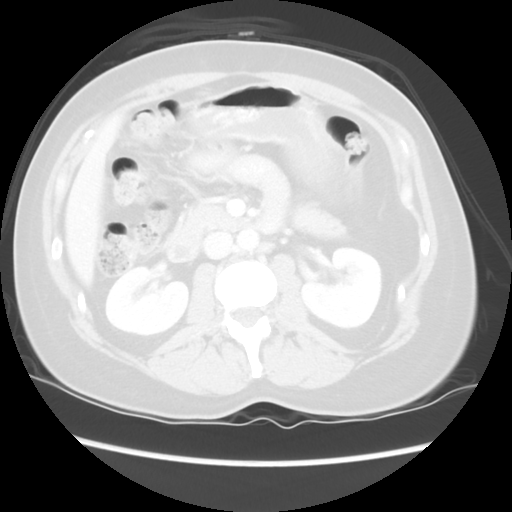
[im 58/81  soft-tissue]
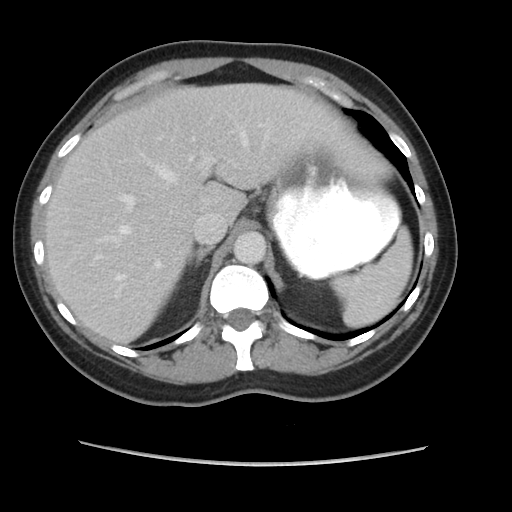
[im 58/81  lung]
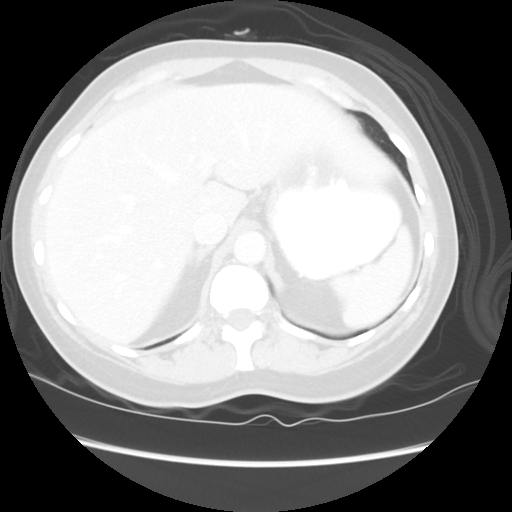
[im 69/81  soft-tissue]
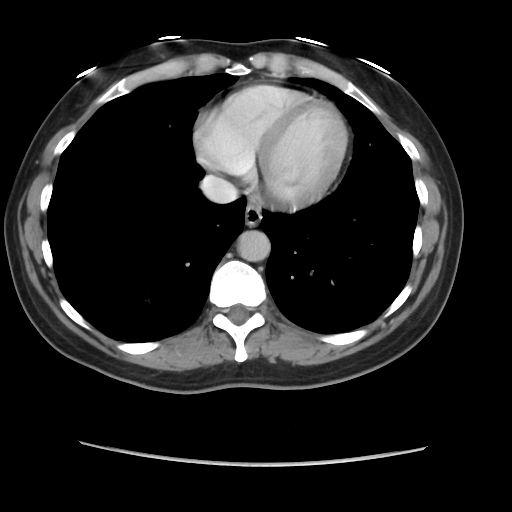
[im 69/81  lung]
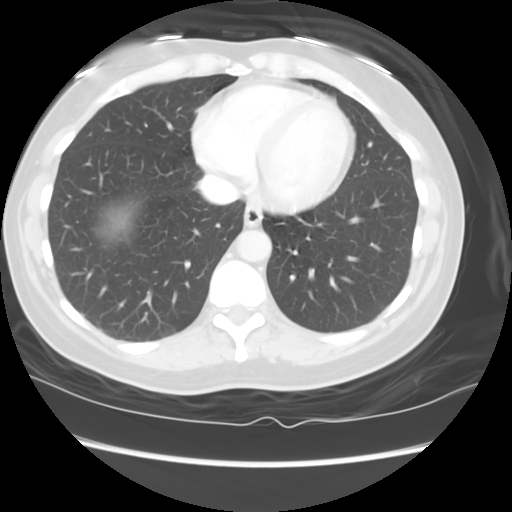

[Series 601: coronal body · coronal · 0.90mm/px · 1 of 119 slices shown, 2 images]
[im 40/119  soft-tissue]
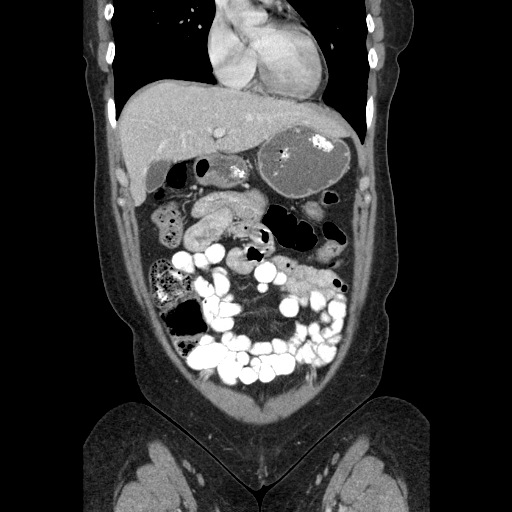
[im 40/119  bone]
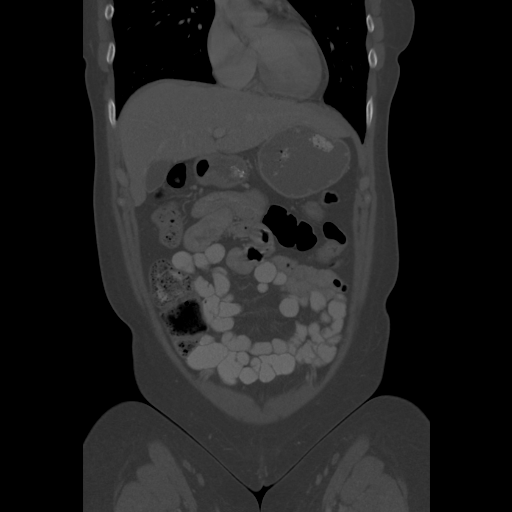

[Series 602: sagittal body · sagittal · 0.90mm/px · 5 of 137 slices shown]
[im 10/137  soft-tissue]
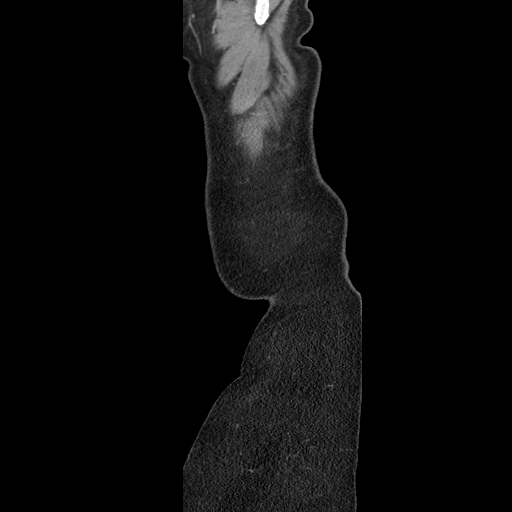
[im 28/137  soft-tissue]
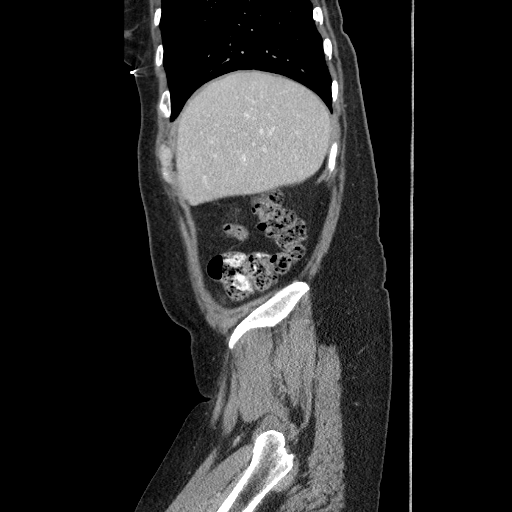
[im 46/137  soft-tissue]
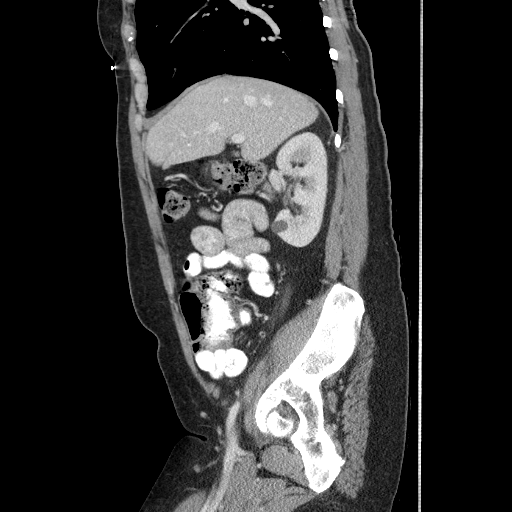
[im 64/137  soft-tissue]
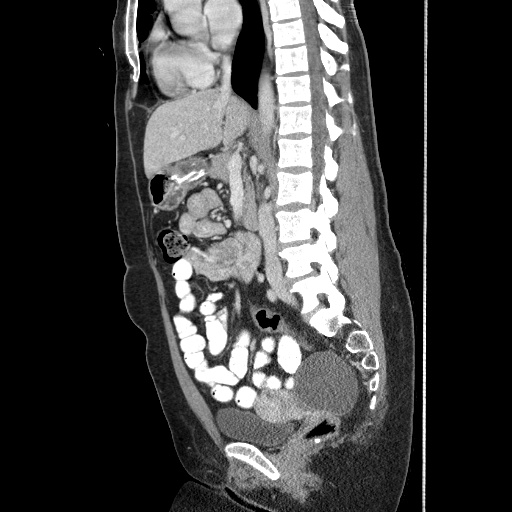
[im 73/137  soft-tissue]
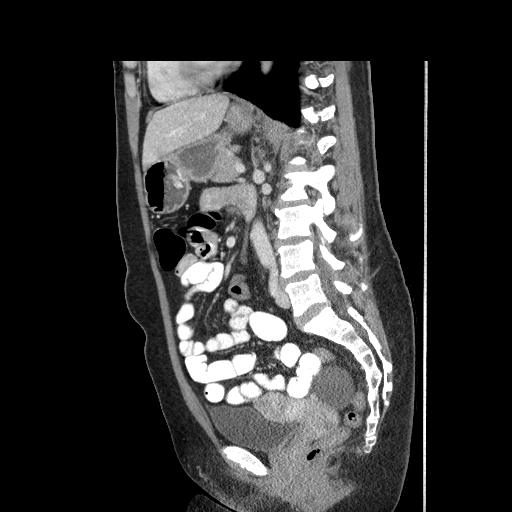

[12 of 36 positions shown; findings below may reference images not displayed]

FINDINGS: Normal hepatic contour. There is a minimal amount of focal title
infiltration adjacent to the fissure for ligamentum teres. No
discrete hepatic lesions. Normal appearance of the gallbladder. No
radiopaque gallstones. No intra or extrahepatic biliary duct
dilatation. No ascites.

There is symmetric enhancement and excretion of the bilateral
kidneys. No definite renal stones. Note is made of an approximately
1.3 cm hypo attenuating, nonenhancing renal cyst. No discrete
left-sided renal lesions. No urinary obstruction or perinephric
stranding. Normal appearance of the bilateral adrenal glands,
pancreas and spleen.

Ingested enteric contrast extends to the level of the cecum. The
bowel is normal in course and caliber without wall thickening or
evidence of obstruction. Normal appearance of the appendix. No
pneumoperitoneum, pneumatosis or portal venous gas.

Normal caliber of the abdominal aorta. The major branch vessels of
the abdominal aorta appear patent on this non CTA examination.
Incidental note is made of a circumaortic left renal vein.

No retroperitoneal, mesenteric, pelvic or inguinal lymphadenopathy.

Note is made of an approximately 6.2 x 4.2 x 5.4 cm right-sided
adnexal cyst. Post bilateral tubal ligation. No discrete left-sided
adnexal lesions. No free fluid within the pelvic cul-de-sac. Normal
appearance of the urinary bladder given degree distention.

Limited visualization of the lower thorax is negative for focal
airspace opacity or pleural effusion.

Normal heart size.  No pericardial effusion.

No acute or aggressive osseous abnormalities. A bone island is
incidentally noted within the L1 vertebral body.

Regional soft tissues appear normal.
IMPRESSION: Note made of an approximately 6.2 cm right-sided adnexal cyst.
Further evaluation could be performed with dedicated pelvic
ultrasound as clinically indicated. Otherwise, no explanation for
patient's bilateral lower abdominal pain.

## 2016-11-27 IMAGING — US US TRANSVAGINAL NON-OB
1 series · 13 of 25 positions shown · non-contrast
Comparison: CT scan 09/16/2014

CLINICAL DATA: Assess right adnexal cyst seen on recent CT scan.

EXAM:
TRANSABDOMINAL AND TRANSVAGINAL ULTRASOUND OF PELVIS
TECHNIQUE: Both transabdominal and transvaginal ultrasound examinations of the
pelvis were performed. Transabdominal technique was performed for
global imaging of the pelvis including uterus, ovaries, adnexal
regions, and pelvic cul-de-sac. It was necessary to proceed with
endovaginal exam following the transabdominal exam to visualize the
ovaries and endometrium.

[Series 1: us transvaginal non-ob · 0.23mm/px · 13 of 66 slices shown]
[im 1/66]
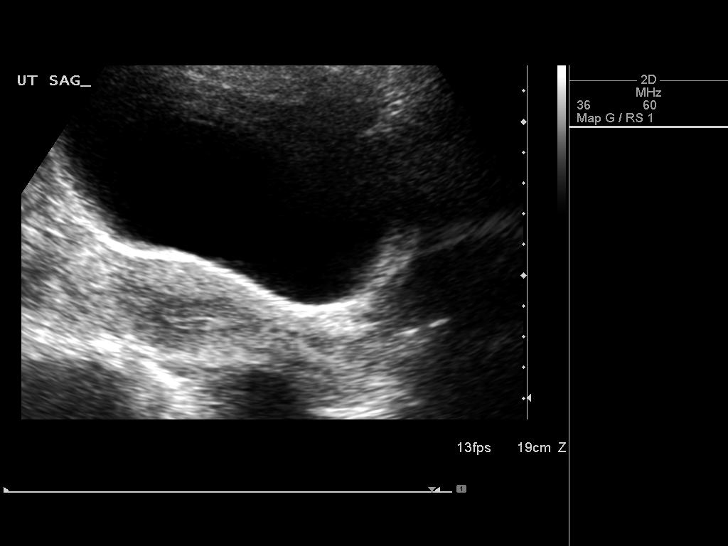
[im 6/66]
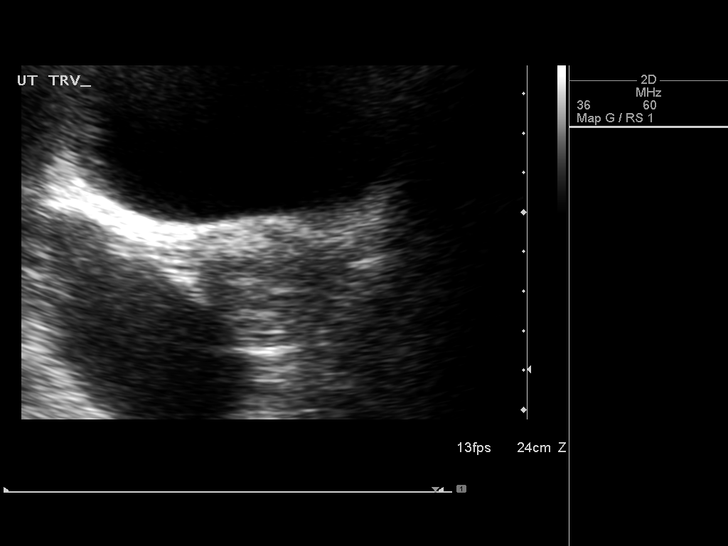
[im 11/66]
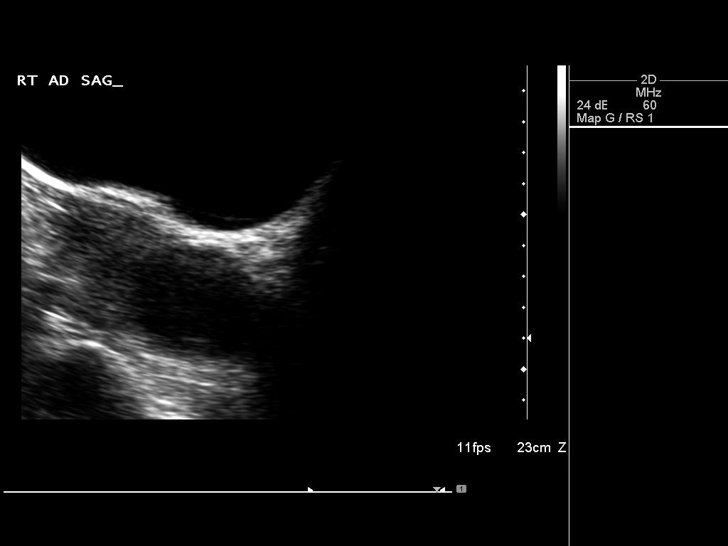
[im 17/66]
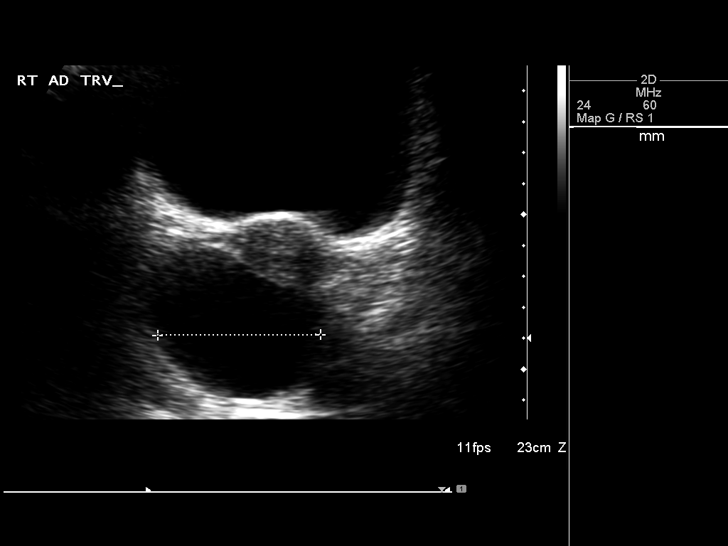
[im 22/66]
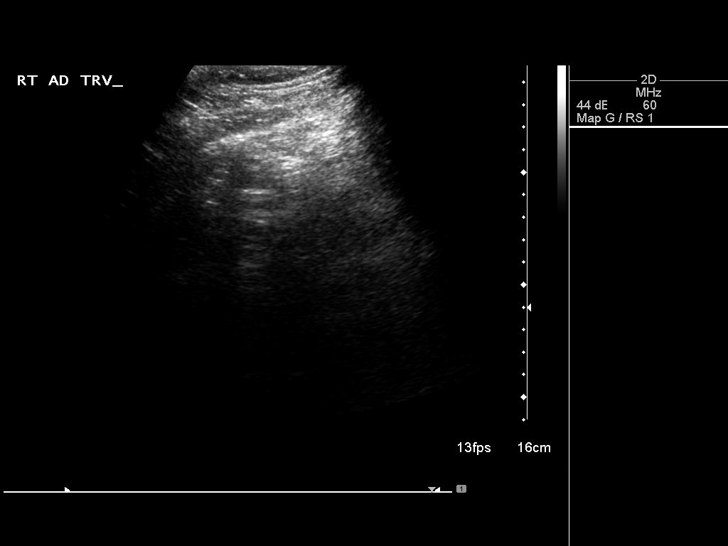
[im 28/66]
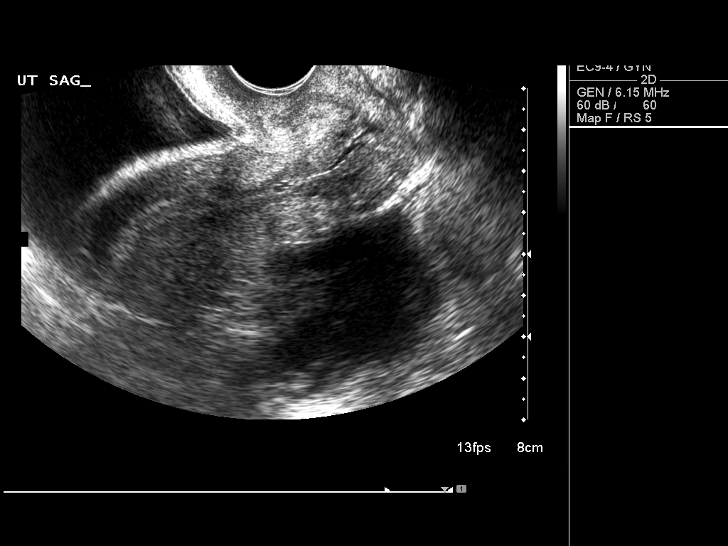
[im 33/66]
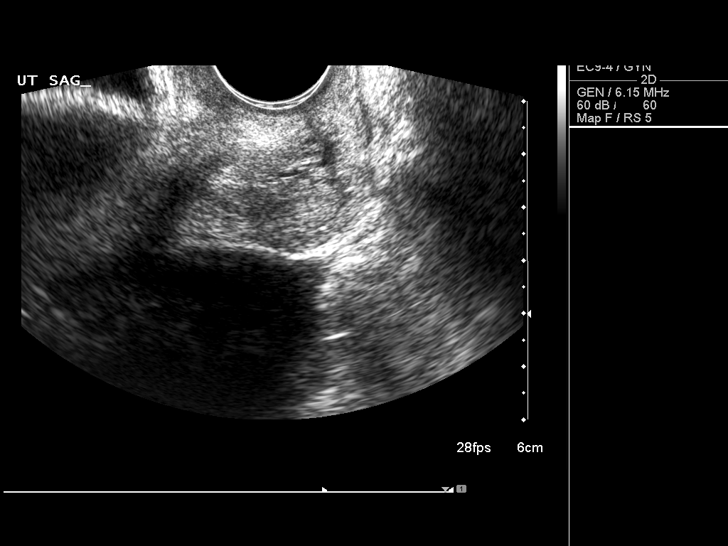
[im 38/66]
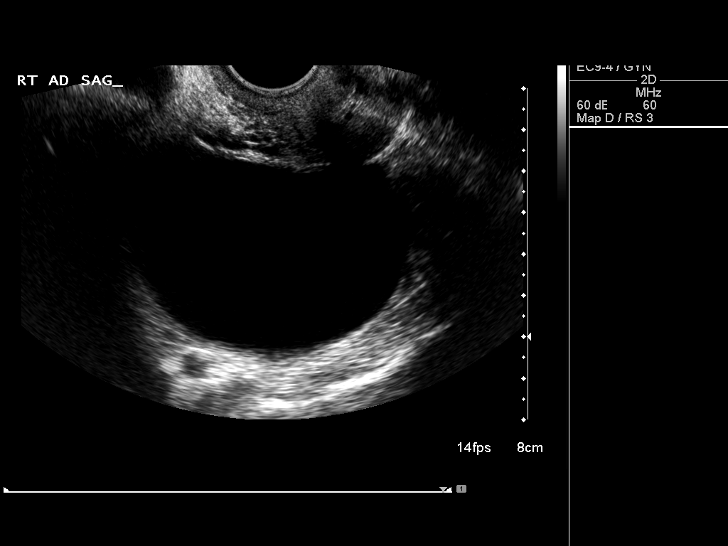
[im 44/66]
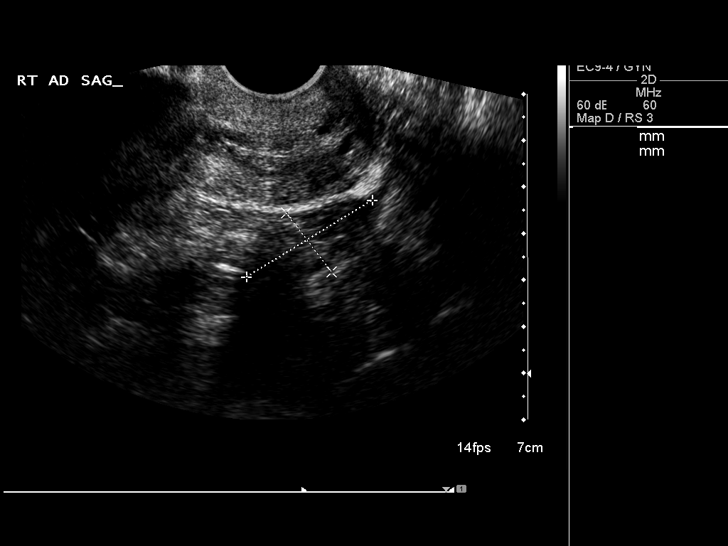
[im 49/66]
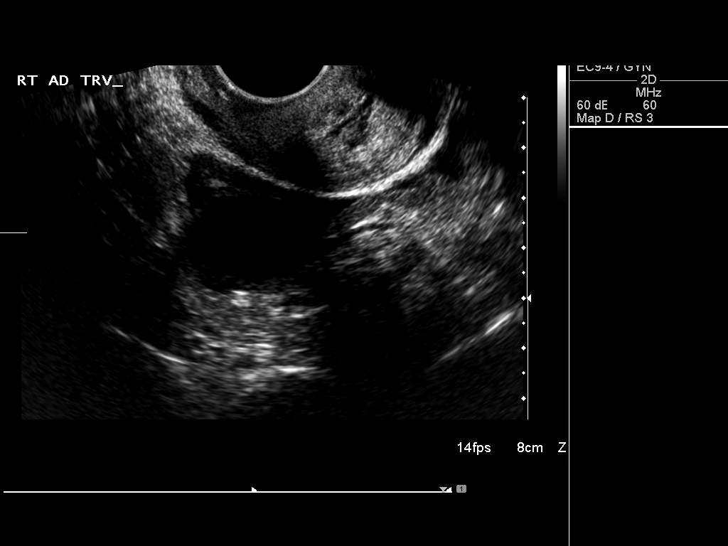
[im 55/66]
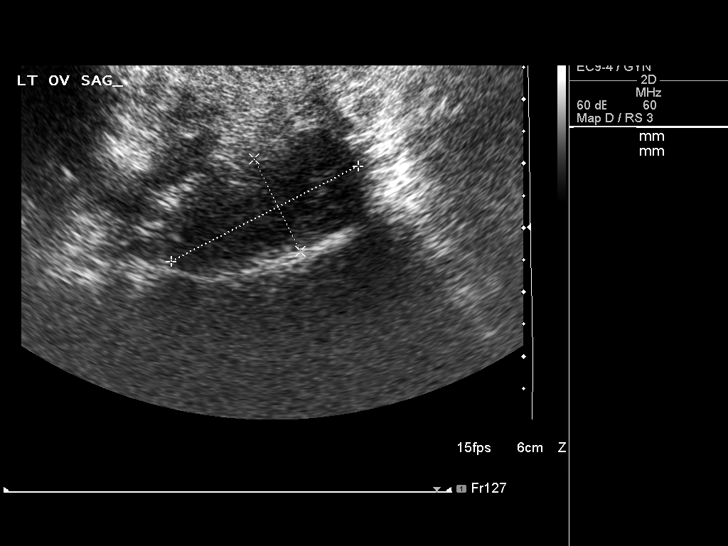
[im 60/66]
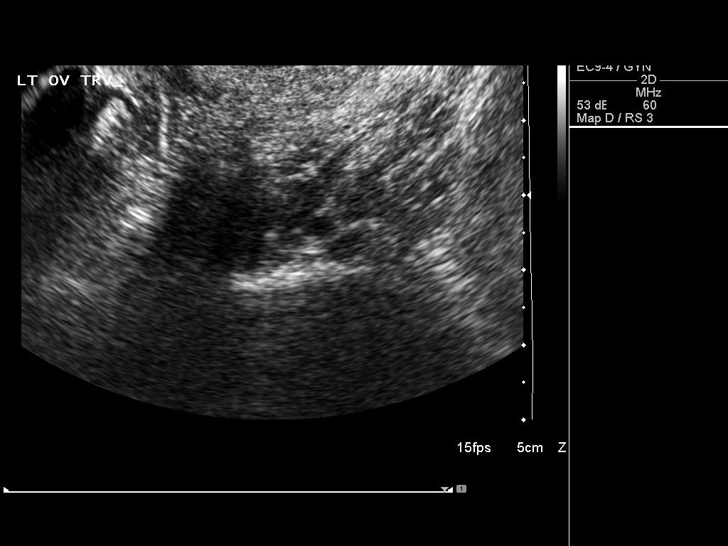
[im 66/66]
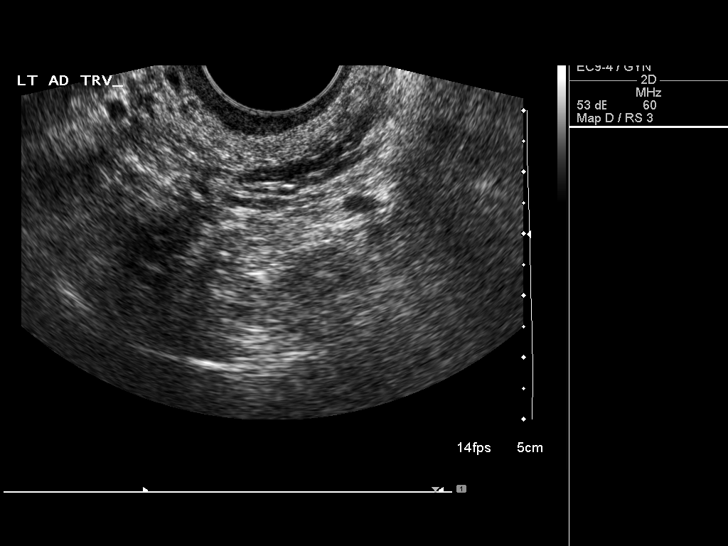

[13 of 25 positions shown; findings below may reference images not displayed]

FINDINGS: Uterus

Measurements: 9.1 x 3.1 x 3.9 cm. No definite fibroids or other
myometrial abnormality although the CT scan does suggest small
scattered fibroids.

Endometrium

Thickness: 5.9 mm.  No focal abnormality visualized.

Right ovary

Measurements: 3.2 x 1.6 x 1.0 cm. There is a simple appearing 6.9 x
4.6 x 5.6 cm cyst as noted on the CT scan. No worrisome sonographic
features.

Left ovary

Measurements: 3.3 x 1.6 x 1.9 cm. Normal appearance/no adnexal mass.

Other findings

No free fluid.
IMPRESSION: 1. 6.9 x 4.6 x 5.6 cm simple right ovarian cyst.
2. Normal left ovary.
3. Normal sonographic appearance of the uterus.
4. No free pelvic fluid collections.

## 2017-12-02 ENCOUNTER — Other Ambulatory Visit: Payer: Self-pay | Admitting: Family Medicine

## 2020-11-02 DIAGNOSIS — Z01419 Encounter for gynecological examination (general) (routine) without abnormal findings: Secondary | ICD-10-CM | POA: Diagnosis not present

## 2020-11-02 DIAGNOSIS — Z1231 Encounter for screening mammogram for malignant neoplasm of breast: Secondary | ICD-10-CM | POA: Diagnosis not present

## 2020-11-02 DIAGNOSIS — Z6824 Body mass index (BMI) 24.0-24.9, adult: Secondary | ICD-10-CM | POA: Diagnosis not present

## 2021-02-03 DIAGNOSIS — I10 Essential (primary) hypertension: Secondary | ICD-10-CM | POA: Diagnosis not present

## 2021-02-03 DIAGNOSIS — E7800 Pure hypercholesterolemia, unspecified: Secondary | ICD-10-CM | POA: Diagnosis not present

## 2021-02-03 DIAGNOSIS — Z0000 Encounter for general adult medical examination without abnormal findings: Secondary | ICD-10-CM | POA: Diagnosis not present

## 2021-02-03 DIAGNOSIS — R69 Illness, unspecified: Secondary | ICD-10-CM | POA: Diagnosis not present

## 2021-02-03 DIAGNOSIS — Z86010 Personal history of colonic polyps: Secondary | ICD-10-CM | POA: Diagnosis not present

## 2021-02-03 DIAGNOSIS — G43909 Migraine, unspecified, not intractable, without status migrainosus: Secondary | ICD-10-CM | POA: Diagnosis not present

## 2021-02-03 DIAGNOSIS — Z0184 Encounter for antibody response examination: Secondary | ICD-10-CM | POA: Diagnosis not present

## 2021-02-03 DIAGNOSIS — J309 Allergic rhinitis, unspecified: Secondary | ICD-10-CM | POA: Diagnosis not present

## 2021-06-05 DIAGNOSIS — Z86010 Personal history of colonic polyps: Secondary | ICD-10-CM | POA: Diagnosis not present

## 2021-06-05 DIAGNOSIS — K5730 Diverticulosis of large intestine without perforation or abscess without bleeding: Secondary | ICD-10-CM | POA: Diagnosis not present

## 2021-06-05 DIAGNOSIS — D123 Benign neoplasm of transverse colon: Secondary | ICD-10-CM | POA: Diagnosis not present

## 2021-06-07 DIAGNOSIS — D123 Benign neoplasm of transverse colon: Secondary | ICD-10-CM | POA: Diagnosis not present

## 2021-08-04 DIAGNOSIS — R0981 Nasal congestion: Secondary | ICD-10-CM | POA: Diagnosis not present

## 2021-08-04 DIAGNOSIS — H6980 Other specified disorders of Eustachian tube, unspecified ear: Secondary | ICD-10-CM | POA: Diagnosis not present

## 2021-12-08 DIAGNOSIS — H6983 Other specified disorders of Eustachian tube, bilateral: Secondary | ICD-10-CM | POA: Diagnosis not present

## 2021-12-08 DIAGNOSIS — J343 Hypertrophy of nasal turbinates: Secondary | ICD-10-CM | POA: Diagnosis not present

## 2021-12-08 DIAGNOSIS — M26621 Arthralgia of right temporomandibular joint: Secondary | ICD-10-CM | POA: Diagnosis not present

## 2022-01-17 DIAGNOSIS — Z1231 Encounter for screening mammogram for malignant neoplasm of breast: Secondary | ICD-10-CM | POA: Diagnosis not present

## 2022-01-17 DIAGNOSIS — N760 Acute vaginitis: Secondary | ICD-10-CM | POA: Diagnosis not present

## 2022-01-17 DIAGNOSIS — Z01419 Encounter for gynecological examination (general) (routine) without abnormal findings: Secondary | ICD-10-CM | POA: Diagnosis not present

## 2022-01-17 DIAGNOSIS — Z6824 Body mass index (BMI) 24.0-24.9, adult: Secondary | ICD-10-CM | POA: Diagnosis not present

## 2022-02-05 DIAGNOSIS — E7800 Pure hypercholesterolemia, unspecified: Secondary | ICD-10-CM | POA: Diagnosis not present

## 2022-02-05 DIAGNOSIS — G43909 Migraine, unspecified, not intractable, without status migrainosus: Secondary | ICD-10-CM | POA: Diagnosis not present

## 2022-02-05 DIAGNOSIS — I10 Essential (primary) hypertension: Secondary | ICD-10-CM | POA: Diagnosis not present

## 2022-02-05 DIAGNOSIS — Z23 Encounter for immunization: Secondary | ICD-10-CM | POA: Diagnosis not present

## 2022-02-05 DIAGNOSIS — R69 Illness, unspecified: Secondary | ICD-10-CM | POA: Diagnosis not present

## 2022-02-05 DIAGNOSIS — Z0000 Encounter for general adult medical examination without abnormal findings: Secondary | ICD-10-CM | POA: Diagnosis not present

## 2022-02-05 DIAGNOSIS — J309 Allergic rhinitis, unspecified: Secondary | ICD-10-CM | POA: Diagnosis not present

## 2022-03-14 DIAGNOSIS — H01001 Unspecified blepharitis right upper eyelid: Secondary | ICD-10-CM | POA: Diagnosis not present

## 2022-03-14 DIAGNOSIS — H01004 Unspecified blepharitis left upper eyelid: Secondary | ICD-10-CM | POA: Diagnosis not present

## 2022-03-14 DIAGNOSIS — H5213 Myopia, bilateral: Secondary | ICD-10-CM | POA: Diagnosis not present

## 2022-03-14 DIAGNOSIS — H524 Presbyopia: Secondary | ICD-10-CM | POA: Diagnosis not present

## 2022-09-05 DIAGNOSIS — M5412 Radiculopathy, cervical region: Secondary | ICD-10-CM | POA: Diagnosis not present

## 2022-09-05 DIAGNOSIS — H6993 Unspecified Eustachian tube disorder, bilateral: Secondary | ICD-10-CM | POA: Diagnosis not present

## 2022-09-05 DIAGNOSIS — M654 Radial styloid tenosynovitis [de Quervain]: Secondary | ICD-10-CM | POA: Diagnosis not present

## 2023-01-17 DIAGNOSIS — M25532 Pain in left wrist: Secondary | ICD-10-CM | POA: Diagnosis not present

## 2023-01-17 DIAGNOSIS — M65312 Trigger thumb, left thumb: Secondary | ICD-10-CM | POA: Diagnosis not present

## 2023-01-30 DIAGNOSIS — Z1231 Encounter for screening mammogram for malignant neoplasm of breast: Secondary | ICD-10-CM | POA: Diagnosis not present

## 2023-01-30 DIAGNOSIS — R319 Hematuria, unspecified: Secondary | ICD-10-CM | POA: Diagnosis not present

## 2023-01-30 DIAGNOSIS — Z6824 Body mass index (BMI) 24.0-24.9, adult: Secondary | ICD-10-CM | POA: Diagnosis not present

## 2023-01-30 DIAGNOSIS — Z01419 Encounter for gynecological examination (general) (routine) without abnormal findings: Secondary | ICD-10-CM | POA: Diagnosis not present

## 2023-02-14 DIAGNOSIS — F17200 Nicotine dependence, unspecified, uncomplicated: Secondary | ICD-10-CM | POA: Diagnosis not present

## 2023-02-14 DIAGNOSIS — J309 Allergic rhinitis, unspecified: Secondary | ICD-10-CM | POA: Diagnosis not present

## 2023-02-14 DIAGNOSIS — G43909 Migraine, unspecified, not intractable, without status migrainosus: Secondary | ICD-10-CM | POA: Diagnosis not present

## 2023-02-14 DIAGNOSIS — E7800 Pure hypercholesterolemia, unspecified: Secondary | ICD-10-CM | POA: Diagnosis not present

## 2023-02-14 DIAGNOSIS — Z0000 Encounter for general adult medical examination without abnormal findings: Secondary | ICD-10-CM | POA: Diagnosis not present

## 2023-02-14 DIAGNOSIS — I10 Essential (primary) hypertension: Secondary | ICD-10-CM | POA: Diagnosis not present

## 2023-02-27 ENCOUNTER — Other Ambulatory Visit: Payer: Self-pay | Admitting: Emergency Medicine

## 2023-02-27 DIAGNOSIS — F17210 Nicotine dependence, cigarettes, uncomplicated: Secondary | ICD-10-CM

## 2023-02-27 DIAGNOSIS — Z122 Encounter for screening for malignant neoplasm of respiratory organs: Secondary | ICD-10-CM

## 2023-02-27 DIAGNOSIS — Z87891 Personal history of nicotine dependence: Secondary | ICD-10-CM

## 2023-03-01 DIAGNOSIS — R82998 Other abnormal findings in urine: Secondary | ICD-10-CM | POA: Diagnosis not present

## 2023-03-18 ENCOUNTER — Encounter: Payer: Self-pay | Admitting: Acute Care

## 2023-03-20 ENCOUNTER — Encounter: Payer: Self-pay | Admitting: Acute Care

## 2023-03-20 ENCOUNTER — Ambulatory Visit: Payer: 59 | Admitting: Acute Care

## 2023-03-20 DIAGNOSIS — F17210 Nicotine dependence, cigarettes, uncomplicated: Secondary | ICD-10-CM

## 2023-03-20 NOTE — Patient Instructions (Signed)
 Thank you for participating in the  Lung Cancer Screening Program. It was our pleasure to meet you today. We will call you with the results of your scan within the next few days. Your scan will be assigned a Lung RADS category score by the physicians reading the scans.  This Lung RADS score determines follow up scanning.  See below for description of categories, and follow up screening recommendations. We will be in touch to schedule your follow up screening annually or based on recommendations of our providers. We will fax a copy of your scan results to your Primary Care Physician, or the physician who referred you to the program, to ensure they have the results. Please call the office if you have any questions or concerns regarding your scanning experience or results.  Our office number is [phone]. Please speak with Abigail Miyamoto, RN., Karlton Lemon RN, or Pietro Cassis RN. They are  our Lung Cancer Screening RN.'s If They are unavailable when you call, Please leave a message on the voice mail. We will return your call at our earliest convenience.This voice mail is monitored several times a day.  Remember, if your scan is normal, we will scan you annually as long as you continue to meet the criteria for the program. (Age 57-80, Current smoker or smoker who has quit within the last 15 years). If you are a smoker, remember, quitting is the single most powerful action that you can take to decrease your risk of lung cancer and other pulmonary, breathing related problems. We know quitting is hard, and we are here to help.  Please let us know if there is anything we can do to help you meet your goal of quitting. If you are a former smoker, Counselling psychologist. We are proud of you! Remain smoke free! Remember you can refer friends or family members through the number above.  We will screen them to make sure they meet criteria for the program. Thank you for helping [place] take better care of you  by participating in Lung Screening.   Lung RADS Categories:  Lung RADS 1: no nodules or definitely non-concerning nodules.  Recommendation is for a repeat annual scan in 12 months.  Lung RADS 2:  nodules that are non-concerning in appearance and behavior with a very low likelihood of becoming an active cancer. Recommendation is for a repeat annual scan in 12 months.  Lung RADS 3: nodules that are probably non-concerning , includes nodules with a low likelihood of becoming an active cancer.  Recommendation is for a 40-month repeat screening scan. Often noted after an upper respiratory illness. We will be in touch to make sure you have no questions, and to schedule your 22-month scan.  Lung RADS 4 A: nodules with concerning findings, recommendation is most often for a follow up scan in 3 months or additional testing based on our provider's assessment of the scan. We will be in touch to make sure you have no questions and to schedule the recommended 3 month follow up scan.  Lung RADS 4 B:  indicates findings that are concerning. We will be in touch with you to schedule additional diagnostic testing based on our provider's  assessment of the scan.  You can receive free nicotine replacement therapy ( patches, gum or mints) by calling 1-800-QUIT NOW. Please call so we can get you on the path to becoming  a non-smoker. I know it is hard, but you can do this!  Other options for assistance in  smoking cessation ( As covered by your insurance benefits)  Hypnosis for smoking cessation  [employer] [phone]  Acupuncture for smoking cessation  [employer] [phone]

## 2023-03-20 NOTE — Progress Notes (Signed)
Virtual Visit via Telephone Note  I connected with Joy Barron on 03/20/23 at 11:00 AM EDT by telephone and verified that I am speaking with the correct person using two identifiers.  Location: Patient:  At home Provider: [address], [place], [place], [address]    I discussed the limitations, risks, security and privacy concerns of performing an evaluation and management service by telephone and the availability of in person appointments. I also discussed with the patient that there may be a patient responsible charge related to this service. The patient expressed understanding and agreed to proceed.   Shared Decision Making Visit Lung Cancer Screening Program [phone])   Eligibility: Age 57 y.o. Pack Years Smoking History Calculation 21 pack years (# packs/per year x # years smoked) Recent History of coughing up blood  no Unexplained weight loss? no ( >Than 15 pounds within the last 6 months ) Prior History Lung / other cancer no (Diagnosis within the last 5 years already requiring surveillance chest CT Scans). Smoking Status Current Smoker Former Smokers: Years since quit:  NA  Quit Date:  NA  Visit Components: Discussion included one or more decision making aids. yes Discussion included risk/benefits of screening. yes Discussion included potential follow up diagnostic testing for abnormal scans. yes Discussion included meaning and risk of over diagnosis. yes Discussion included meaning and risk of False Positives. yes Discussion included meaning of total radiation exposure. yes  Counseling Included: Importance of adherence to annual lung cancer LDCT screening. yes Impact of comorbidities on ability to participate in the program. yes Ability and willingness to under diagnostic treatment. yes  Smoking Cessation Counseling: Current Smokers:  Discussed importance of smoking cessation. yes Information about tobacco cessation classes and interventions provided to  patient. yes Patient provided with "ticket" for LDCT Scan. yes Symptomatic Patient. no  Counseling NA Diagnosis Code: Tobacco Use Z72.0 Asymptomatic Patient yes  Counseling (Intermediate counseling: > three minutes counseling) [id number] Former Smokers:  Discussed the importance of maintaining cigarette abstinence. yes Diagnosis Code: Personal History of Nicotine Dependence. W29.562 Information about tobacco cessation classes and interventions provided to patient. Yes Patient provided with "ticket" for LDCT Scan. yes Written Order for Lung Cancer Screening with LDCT placed in Epic. Yes (CT Chest Lung Cancer Screening Low Dose W/O CM) [id number] Z12.2-Screening of respiratory organs Z87.891-Personal history of nicotine dependence  I have spent 25 minutes of face to face/ virtual visit   time with  Joy Barron discussing the risks and benefits of lung cancer screening. We viewed / discussed a power point together that explained in detail the above noted topics. We paused at intervals to allow for questions to be asked and answered to ensure understanding.We discussed that the single most powerful action that she can take to decrease her risk of developing lung cancer is to quit smoking. We discussed whether or not she is ready to commit to setting a quit date. We discussed options for tools to aid in quitting smoking including nicotine replacement therapy, non-nicotine medications, support groups, Quit Smart classes, and behavior modification. We discussed that often times setting smaller, more achievable goals, such as eliminating 1 cigarette a day for a week and then 2 cigarettes a day for a week can be helpful in slowly decreasing the number of cigarettes smoked. This allows for a sense of accomplishment as well as providing a clinical benefit. I provided  her  with smoking cessation  information  with contact information for community resources, classes, free nicotine replacement  therapy, and access to  mobile apps, text messaging, and on-line smoking cessation help. I have also provided  her  the office contact information in the event she needs to contact me, or the screening staff. We discussed the time and location of the scan, and that either Joy Miyamoto RN, Joy Lemon, RN  or I will call / send a letter with the results within 24-72 hours of receiving them. The patient verbalized understanding of all of  the above and had no further questions upon leaving the office. They have my contact information in the event they have any further questions.  I spent 3-4 minutes counseling on smoking cessation and the health risks of continued tobacco abuse.  I explained to the patient that there has been a high incidence of coronary artery disease noted on these exams. I explained that this is a non-gated exam therefore degree or severity cannot be determined. This patient is not on statin therapy per EPIC review. I have asked the patient to follow-up with their PCP regarding any incidental finding of coronary artery disease and management with diet or medication as their PCP  feels is clinically indicated. The patient verbalized understanding of the above and had no further questions upon completion of the visit.      Bevelyn Ngo, NP 03/20/2023

## 2023-03-21 ENCOUNTER — Ambulatory Visit
Admission: RE | Admit: 2023-03-21 | Discharge: 2023-03-21 | Disposition: A | Discharge status: Home / Self Care | Payer: 59 | Source: Ambulatory Visit | Attending: Acute Care | Admitting: Acute Care

## 2023-03-21 DIAGNOSIS — F17210 Nicotine dependence, cigarettes, uncomplicated: Secondary | ICD-10-CM | POA: Diagnosis not present

## 2023-03-21 DIAGNOSIS — Z122 Encounter for screening for malignant neoplasm of respiratory organs: Secondary | ICD-10-CM

## 2023-03-21 DIAGNOSIS — Z87891 Personal history of nicotine dependence: Secondary | ICD-10-CM

## 2023-04-05 ENCOUNTER — Other Ambulatory Visit: Payer: Self-pay | Admitting: Acute Care

## 2023-04-05 DIAGNOSIS — F17210 Nicotine dependence, cigarettes, uncomplicated: Secondary | ICD-10-CM

## 2023-04-05 DIAGNOSIS — Z87891 Personal history of nicotine dependence: Secondary | ICD-10-CM

## 2023-04-05 DIAGNOSIS — Z122 Encounter for screening for malignant neoplasm of respiratory organs: Secondary | ICD-10-CM

## 2024-03-23 ENCOUNTER — Ambulatory Visit
Admission: RE | Admit: 2024-03-23 | Discharge: 2024-03-23 | Disposition: A | Discharge status: Home / Self Care | Source: Ambulatory Visit | Attending: Acute Care | Admitting: Acute Care

## 2024-03-23 DIAGNOSIS — Z122 Encounter for screening for malignant neoplasm of respiratory organs: Secondary | ICD-10-CM

## 2024-03-23 DIAGNOSIS — F17210 Nicotine dependence, cigarettes, uncomplicated: Secondary | ICD-10-CM

## 2024-03-23 DIAGNOSIS — Z87891 Personal history of nicotine dependence: Secondary | ICD-10-CM

## 2024-03-30 ENCOUNTER — Other Ambulatory Visit: Payer: Self-pay | Admitting: Acute Care

## 2024-03-30 DIAGNOSIS — Z122 Encounter for screening for malignant neoplasm of respiratory organs: Secondary | ICD-10-CM

## 2024-03-30 DIAGNOSIS — F17210 Nicotine dependence, cigarettes, uncomplicated: Secondary | ICD-10-CM

## 2024-03-30 DIAGNOSIS — Z87891 Personal history of nicotine dependence: Secondary | ICD-10-CM
